# Patient Record
Sex: Female | Born: 2000 | Race: Black or African American | Hispanic: No | Marital: Single | Smoking: Never smoker
Health system: Southern US, Community
[De-identification: ages and names within clinical notes are randomized; demographics above are authoritative.]

## PROBLEM LIST (undated history)

## (undated) DIAGNOSIS — L309 Dermatitis, unspecified: Secondary | ICD-10-CM

## (undated) DIAGNOSIS — F419 Anxiety disorder, unspecified: Secondary | ICD-10-CM

## (undated) DIAGNOSIS — F32A Depression, unspecified: Secondary | ICD-10-CM

## (undated) DIAGNOSIS — D649 Anemia, unspecified: Secondary | ICD-10-CM

## (undated) HISTORY — DX: Depression, unspecified: F32.A

## (undated) HISTORY — PX: NO PAST SURGERIES: SHX2092

## (undated) HISTORY — DX: Anemia, unspecified: D64.9

## (undated) HISTORY — DX: Anxiety disorder, unspecified: F41.9

---
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
## 2021-01-06 ENCOUNTER — Encounter (HOSPITAL_COMMUNITY): Payer: Self-pay

## 2021-01-06 ENCOUNTER — Ambulatory Visit (HOSPITAL_COMMUNITY)
Admission: EM | Admit: 2021-01-06 | Discharge: 2021-01-06 | Disposition: A | Discharge status: Home / Self Care | Payer: Medicaid Other | Attending: Emergency Medicine | Admitting: Emergency Medicine

## 2021-01-06 DIAGNOSIS — B9689 Other specified bacterial agents as the cause of diseases classified elsewhere: Secondary | ICD-10-CM | POA: Diagnosis not present

## 2021-01-06 DIAGNOSIS — N760 Acute vaginitis: Secondary | ICD-10-CM | POA: Diagnosis not present

## 2021-01-06 DIAGNOSIS — Z113 Encounter for screening for infections with a predominantly sexual mode of transmission: Secondary | ICD-10-CM | POA: Insufficient documentation

## 2021-01-06 DIAGNOSIS — N898 Other specified noninflammatory disorders of vagina: Secondary | ICD-10-CM | POA: Diagnosis not present

## 2021-01-06 LAB — HIV ANTIBODY (ROUTINE TESTING W REFLEX): HIV Screen 4th Generation wRfx: NONREACTIVE

## 2021-01-06 NOTE — ED Provider Notes (Signed)
[facility]    CSN: [id number] Arrival date & time: 01/06/21  0865      History   Chief Complaint Chief Complaint  Patient presents with  . Vaginal Discharge  . Vaginal Itching  . STD testing    HPI Beth Curtis is a 20 y.o. female.   Evaluation of vaginal itching and discharge that started after stopping birth control.  Also requesting STD screening.  Reports discharge is thick and white.  Has not taken any OTC medications or treatments. Denies any trauma, injury, or other precipitating event.  Denies any specific alleviating or aggravating factors.  Denies any fevers, chest pain, shortness of breath, N/V/D, numbness, tingling, weakness, abdominal pain, or headaches.   ROS: As per HPI, all other pertinent ROS negative   The history is provided by the patient.  Vaginal Discharge Associated symptoms: vaginal itching   Vaginal Itching    Past Medical History:  Diagnosis Date  . Asthma   . Eczema   . 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 Tachycardia 07/22/2020  . Breakthrough bleeding on Nexplanon 07/08/2020  . H/O chlamydia infection 06/11/2020  . Folliculitis 06/11/2020  . Iron deficiency anemia 09/28/2019  . Prediabetes 09/28/2019  . 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 Allergic rhinitis 10/24/2006    History reviewed. No pertinent surgical history.  OB History   No obstetric history on file.      Home Medications    Prior to Admission medications   Medication Sig Start Date End Date Taking? Authorizing Provider  clobetasol ointment (TEMOVATE) 0.05 % APPLY TOPICALLY TO THE AFFECTED AREA TWICE DAILY 12/31/20  Yes Derrel Nip, MD  desogestrel-ethinyl estradiol (APRI) 0.15-30 MG-MCG tablet Take 1 tablet by mouth daily. 08/11/20  Yes Nestor Ramp, MD  hydrOXYzine (ATARAX/VISTARIL) 25 MG tablet TAKE 1 TABLET BY MOUTH AS NEEDED FOR ITCHING 11/30/20  Yes Maness, Loistine Chance, MD   cetirizine (ZYRTEC) 10 MG tablet Take 1 tablet (10 mg total) by mouth daily. 05/06/20   Dana Allan, MD  ferrous sulfate 324 (65 Fe) MG TBEC Take 1 tablet (325 mg total) by mouth daily. Patient not taking: Reported on 07/08/2020 01/13/20   Myrene Buddy, MD    Family History Family History  Problem Relation Age of Onset  . Cancer Father     Social History Social History   Tobacco Use  . Smoking status: Passive Smoke Exposure - Never Smoker  . Smokeless tobacco: Never Used  Substance Use Topics  . Alcohol use: Not Currently  . Drug use: Not Currently     Allergies   Patient has no known allergies.   Review of Systems Review of Systems  Genitourinary: Positive for vaginal discharge.  All other systems reviewed and are negative.    Physical Exam Triage Vital Signs ED Triage Vitals  Enc Vitals Group     BP 01/06/21 1023 128/76     Pulse Rate 01/06/21 1023 85     Resp 01/06/21 1023 18     Temp 01/06/21 1023 98.1 F (36.7 C)     Temp src --      SpO2 01/06/21 1023 98 %     Weight --      Height --      Head Circumference --      Peak Flow --      Pain Score 01/06/21 1020 0     Pain Loc --  Pain Edu? --      Excl. in [place]? --    No data found.  Updated Vital Signs BP 128/76   Pulse 85   Temp 98.1 F (36.7 C)   Resp 18   LMP 12/13/2020   SpO2 98%   Visual Acuity Right Eye Distance:   Left Eye Distance:   Bilateral Distance:    Right Eye Near:   Left Eye Near:    Bilateral Near:     Physical Exam Vitals and nursing note reviewed.  Constitutional:      General: She is not in acute distress.    Appearance: Normal appearance. She is not ill-appearing, toxic-appearing or diaphoretic.  HENT:     Head: Normocephalic and atraumatic.  Eyes:     Conjunctiva/sclera: Conjunctivae normal.  Cardiovascular:     Rate and Rhythm: Normal rate.     Pulses: Normal pulses.  Pulmonary:     Effort: Pulmonary effort is normal.  Abdominal:     General:  Abdomen is flat.  Genitourinary:    Comments: decline Musculoskeletal:        General: Normal range of motion.     Cervical back: Normal range of motion.  Skin:    General: Skin is warm and dry.  Neurological:     General: No focal deficit present.     Mental Status: She is alert and oriented to person, place, and time.  Psychiatric:        Mood and Affect: Mood normal.      UC Treatments / Results  Labs (all labs ordered are listed, but only abnormal results are displayed) Labs Reviewed  RPR  HIV ANTIBODY (ROUTINE TESTING W REFLEX)  CERVICOVAGINAL ANCILLARY ONLY    EKG   Radiology No results found.  Procedures Procedures (including critical care time)  Medications Ordered in UC Medications - No data to display  Initial Impression / Assessment and Plan / UC Course  I have reviewed the triage vital signs and the nursing notes.  Pertinent labs & imaging results that were available during my care of the patient were reviewed by me and considered in my medical decision making (see chart for details).    Assessment negative for red flags or concerns.  Recommend Monistat or other OTC vaginal itch cream until self swab results.  Self swab obtained and will treat based on results.  Also obtain RPR and HIV lab work.  Discussed safe sex practices including condom or other barrier method use.  Follow-up as needed. Final Clinical Impressions(s) / UC Diagnoses   Final diagnoses:  Acute vaginitis  Vaginal discharge  Screen for STD (sexually transmitted disease)     Discharge Instructions     Use monistat cream for the itching and irritation.    We will contact you with the results from your lab work and any additional treatment.    Do not have sex while taking undergoing treatment for STI.  Make sure that all of your partners get tested and treated.   Use a condom or other barrier method for all sexual encounters.    Return or go to the Emergency Department if  symptoms worsen or do not improve in the next few days.      ED Prescriptions    None     PDMP not reviewed this encounter.   Ivette Loyal, NP 01/06/21 1055

## 2021-01-06 NOTE — ED Triage Notes (Signed)
Pt states would like routine STD testing. She adds that she thinks she has a yeast infection because she has had vaginal itching and white discharge.

## 2021-01-06 NOTE — Discharge Instructions (Signed)
Use monistat cream for the itching and irritation.    We will contact you with the results from your lab work and any additional treatment.    Do not have sex while taking undergoing treatment for STI.  Make sure that all of your partners get tested and treated.   Use a condom or other barrier method for all sexual encounters.    Return or go to the Emergency Department if symptoms worsen or do not improve in the next few days.

## 2021-01-07 LAB — RPR: RPR Ser Ql: NONREACTIVE

## 2021-01-09 ENCOUNTER — Telehealth (HOSPITAL_COMMUNITY): Payer: Self-pay | Admitting: Emergency Medicine

## 2021-01-09 LAB — CERVICOVAGINAL ANCILLARY ONLY
Bacterial Vaginitis (gardnerella): NEGATIVE
Candida Glabrata: NEGATIVE
Candida Vaginitis: POSITIVE — AB
Chlamydia: NEGATIVE
Comment: NEGATIVE
Comment: NEGATIVE
Comment: NEGATIVE
Comment: NEGATIVE
Comment: NEGATIVE
Comment: NORMAL
Neisseria Gonorrhea: NEGATIVE
Trichomonas: NEGATIVE

## 2021-01-09 MED ORDER — FLUCONAZOLE 150 MG PO TABS: 150.0000 mg | ORAL_TABLET | Freq: Once | ORAL | 0 refills | Status: AC

## 2021-02-08 ENCOUNTER — Ambulatory Visit (INDEPENDENT_AMBULATORY_CARE_PROVIDER_SITE_OTHER): Payer: Medicaid Other | Admitting: Family Medicine

## 2021-02-08 ENCOUNTER — Other Ambulatory Visit: Payer: Self-pay

## 2021-02-08 VITALS — BP 135/84 | HR 91 | Wt 233.4 lb

## 2021-02-08 DIAGNOSIS — N898 Other specified noninflammatory disorders of vagina: Secondary | ICD-10-CM

## 2021-02-08 DIAGNOSIS — Z131 Encounter for screening for diabetes mellitus: Secondary | ICD-10-CM

## 2021-02-08 LAB — POCT WET PREP (WET MOUNT)
Clue Cells Wet Prep Whiff POC: NEGATIVE
Trichomonas Wet Prep HPF POC: ABSENT

## 2021-02-08 LAB — POCT GLYCOSYLATED HEMOGLOBIN (HGB A1C): Hemoglobin A1C: 5.5 % (ref 4.0–5.6)

## 2021-02-08 MED ORDER — FLUCONAZOLE 150 MG PO TABS: ORAL_TABLET | ORAL | 0 refills | Status: DC

## 2021-02-08 NOTE — Assessment & Plan Note (Addendum)
Wet prep notable for yeast.  Declined STD testing - Will treat with Diflucan x 2.  - F/U if symptoms not improving or getting worse.  - F/U with PCP as needed.  - second yeast infection in 2 months. Discussed Self care instructions. Diabetes screen negative.

## 2021-02-08 NOTE — Progress Notes (Signed)
  Subjective    Beth Curtis is a 20 y.o. female who complains of white and thick vaginal discharge for 2-3 days. Itching and irritated. Notes having a yeast infection about 1 month ago.  Denies abnormal vaginal bleeding, significant pelvic pain or fever.. Denies dysuria but does endorse frequency. Sexually active with 1 female partner. Denies history of known exposure to STD. Not Interested in STD testing.  LMP: 01/13/21. No history of STD's.  No abdominal pain, nausea, or vomiting.  No fevers or chills.     Review of Systems  See HPI above for review of systems.    Objective:    BP 135/84   Pulse 91   Wt 233 lb 6.4 oz (105.9 kg)   SpO2 100%   BMI 44.10 kg/m   Gen:  She appears well, afebrile.  Resp: breathing comfortably on room air, speaking in full sentences GYN:  External genitalia within normal limits.  Vaginal mucosa pink, moist, normal rugae. Thick white cottage cheese discharge throughout.  Nonfriable cervix but very erythematous, copious amount of thick white cottage cheese discharge throughout, no bleeding noted on speculum exam.  Bimanual exam revealed normal, nongravid uterus.  Mild tenderness to cervical palpation but no significant pain. No adnexal masses bilaterally.   Exam Chaperoned by: Deserree B.   Assessment & Plan:   Vaginal discharge Wet prep notable for yeast.  Declined STD testing - Will treat with Diflucan x 2.  - F/U if symptoms not improving or getting worse.  - F/U with PCP as needed.  - second yeast infection in 2 months. Discussed Self care instructions. Diabetes screen negative.    Orders Placed This Encounter  Procedures   POCT glycosylated hemoglobin (Hb A1C)   POCT Wet Prep [facility])    Orpah Cobb, DO [facility], PGY3 02/08/2021 11:48 AM

## 2021-02-28 DIAGNOSIS — J302 Other seasonal allergic rhinitis: Secondary | ICD-10-CM | POA: Diagnosis not present

## 2021-02-28 DIAGNOSIS — R5383 Other fatigue: Secondary | ICD-10-CM | POA: Diagnosis not present

## 2021-02-28 DIAGNOSIS — R7303 Prediabetes: Secondary | ICD-10-CM | POA: Diagnosis not present

## 2021-02-28 DIAGNOSIS — M940 Chondrocostal junction syndrome [Tietze]: Secondary | ICD-10-CM | POA: Diagnosis not present

## 2021-02-28 DIAGNOSIS — D508 Other iron deficiency anemias: Secondary | ICD-10-CM | POA: Diagnosis not present

## 2021-03-04 DIAGNOSIS — B373 Candidiasis of vulva and vagina: Secondary | ICD-10-CM | POA: Diagnosis not present

## 2021-03-29 DIAGNOSIS — J302 Other seasonal allergic rhinitis: Secondary | ICD-10-CM | POA: Diagnosis not present

## 2021-03-29 DIAGNOSIS — R5383 Other fatigue: Secondary | ICD-10-CM | POA: Diagnosis not present

## 2021-03-29 DIAGNOSIS — D508 Other iron deficiency anemias: Secondary | ICD-10-CM | POA: Diagnosis not present

## 2021-03-29 DIAGNOSIS — R7303 Prediabetes: Secondary | ICD-10-CM | POA: Diagnosis not present

## 2021-04-03 ENCOUNTER — Other Ambulatory Visit: Payer: Self-pay | Admitting: Family Medicine

## 2021-04-03 DIAGNOSIS — L2082 Flexural eczema: Secondary | ICD-10-CM

## 2021-04-28 DIAGNOSIS — R7303 Prediabetes: Secondary | ICD-10-CM | POA: Diagnosis not present

## 2021-04-28 DIAGNOSIS — R000 Tachycardia, unspecified: Secondary | ICD-10-CM | POA: Diagnosis not present

## 2021-04-28 DIAGNOSIS — J302 Other seasonal allergic rhinitis: Secondary | ICD-10-CM | POA: Diagnosis not present

## 2021-04-28 DIAGNOSIS — R002 Palpitations: Secondary | ICD-10-CM | POA: Diagnosis not present

## 2021-04-28 DIAGNOSIS — D508 Other iron deficiency anemias: Secondary | ICD-10-CM | POA: Diagnosis not present

## 2021-04-28 DIAGNOSIS — R5383 Other fatigue: Secondary | ICD-10-CM | POA: Diagnosis not present

## 2021-05-19 ENCOUNTER — Ambulatory Visit: Payer: Medicaid Other | Admitting: Cardiology

## 2021-05-25 ENCOUNTER — Encounter: Payer: Self-pay | Admitting: Cardiology

## 2021-05-25 ENCOUNTER — Ambulatory Visit: Payer: Medicaid Other | Admitting: Cardiology

## 2021-05-25 ENCOUNTER — Other Ambulatory Visit: Payer: Self-pay

## 2021-05-25 VITALS — BP 134/82 | HR 108 | Temp 98.2°F | Ht 61.0 in | Wt 237.0 lb

## 2021-05-25 DIAGNOSIS — R002 Palpitations: Secondary | ICD-10-CM | POA: Diagnosis not present

## 2021-05-25 DIAGNOSIS — R0789 Other chest pain: Secondary | ICD-10-CM

## 2021-05-25 DIAGNOSIS — D500 Iron deficiency anemia secondary to blood loss (chronic): Secondary | ICD-10-CM | POA: Diagnosis not present

## 2021-05-25 DIAGNOSIS — R9431 Abnormal electrocardiogram [ECG] [EKG]: Secondary | ICD-10-CM

## 2021-05-25 NOTE — Progress Notes (Signed)
Primary Physician/Referring:  Norm Salt, PA  Patient ID: Beth Curtis, female    DOB: 10-17-2000, 20 y.o.   MRN: [id number]  Chief Complaint  Patient presents with   Tachycardia   New Patient (Initial Visit)   Chest Pain   HPI:    Beth Curtis  is a 20 y.o. African-American female patient referred to me for evaluation of palpitations and chest pain.  Palpitations described as increased heart rate with doing minimal activity and sometimes occurring at rest, notices her heart rate goes all the way up to 120 230 bpm.  No other associated symptoms of dizziness or syncope or chest pain.  She also complains of left-sided chest pain and localizes with her finger at the left parasternal region.  States that it comes on and off, last a few seconds to few minutes, sometimes loose behind tightness like sensation in the chest.  Does not seem to be related to exertional activity.  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 No pertinent surgical history. Family History  Problem Relation Age of Onset   Cancer Father    Cancer Maternal Grandfather    Cancer Paternal Grandmother     Social History   Tobacco Use   Smoking status: Never    Passive exposure: Yes   Smokeless tobacco: Never  Substance Use Topics   Alcohol use: Not Currently   Marital Status: Single  ROS  Review of Systems  Cardiovascular:  Positive for chest pain and palpitations. Negative for dyspnea on exertion and leg swelling.  Gastrointestinal:  Negative for melena.  Objective  Blood pressure 134/82, pulse (!) 108, temperature 98.2 F (36.8 C), temperature source Temporal, height 5\' 1"  (1.549 m), weight 237 lb (107.5 kg), SpO2 100 %. Body mass index is 44.78 kg/m.  Vitals with BMI 05/25/2021 02/08/2021 01/06/2021  Height 5\' 1"  - -  Weight 237 lbs 233 lbs 6 oz -  BMI 44.8 - -  Systolic 134 135 01/08/2021  Diastolic 82 84 76  Pulse 108 91 85      Physical Exam Constitutional:      Comments: Morbidly obese  Neck:     Vascular: No carotid bruit or JVD.  Cardiovascular:     Rate and Rhythm: Normal rate and regular rhythm.     Pulses: Intact distal pulses.     Heart sounds: Normal heart sounds. No murmur heard.   No gallop.  Pulmonary:     Effort: Pulmonary effort is normal.     Breath sounds: Normal breath sounds.  Abdominal:     General: Bowel sounds are normal.     Palpations: Abdomen is soft.  Musculoskeletal:        General: No swelling.     Laboratory examination:   No results for input(s): NA, K, CL, CO2, GLUCOSE, BUN, CREATININE, CALCIUM, GFRNONAA, GFRAA in the last 8760 hours. CrCl cannot be calculated (No successful lab value found.).  No flowsheet data found. CBC Latest Ref Rng & Units 07/20/2020 07/08/2020 09/25/2019  WBC 3.4 - 10.8 x10E3/uL 10.7 10.8 8.9  Hemoglobin 11.1 - 15.9 g/dL 10.0(L) 9.7(L) 10.8(L)  Hematocrit 34.0 - 46.6 % 33.3(L) 32.9(L) 34.9  Platelets 150 - 450 x10E3/uL 574(H) 590(H) 347    Lipid Panel No results for input(s): CHOL, TRIG, LDLCALC, VLDL, HDL, CHOLHDL, LDLDIRECT in the last 8760 hours. Lipid Panel     Component Value Date/Time   CHOL  150 09/25/2019 1413   TRIG 66 09/25/2019 1413   HDL 44 09/25/2019 1413   CHOLHDL 3.4 09/25/2019 1413   LDLCALC 93 09/25/2019 1413   LABVLDL 13 09/25/2019 1413     HEMOGLOBIN A1C Lab Results  Component Value Date   HGBA1C 5.5 02/08/2021   TSH No results for input(s): TSH in the last 8760 hours.  External labs:   Labs 03/29/2021: Hb 8.9/HCT 32.2, platelets 564.  Microcytic indicis.  Medications and allergies  No Known Allergies   Medication prior to this encounter:   Outpatient Medications Prior to Visit  Medication Sig Dispense Refill   cetirizine (ZYRTEC) 10 MG tablet Take 1 tablet (10 mg total) by mouth daily. 90 tablet 3   clobetasol ointment (TEMOVATE) 0.05 % APPLY TOPICALLY TO THE AFFECTED AREA TWICE DAILY 90 g 1   FEROSUL  325 (65 Fe) MG tablet Take 325 mg by mouth 3 (three) times daily.     hydrOXYzine (ATARAX/VISTARIL) 25 MG tablet TAKE 1 TABLET BY MOUTH AS NEEDED FOR ITCHING 90 tablet 0   propranolol (INDERAL) 20 MG tablet Take 20 mg by mouth 2 (two) times daily.     desogestrel-ethinyl estradiol (APRI) 0.15-30 MG-MCG tablet Take 1 tablet by mouth daily. (Patient not taking: Reported on 05/25/2021) 90 tablet 3   fluconazole (DIFLUCAN) 150 MG tablet Take 1 tablet now, then 1 tablet in 72 hours 2 tablet 0   No facility-administered medications prior to visit.     Medication list after today's encounter   Current Outpatient Medications  Medication Instructions   cetirizine (ZYRTEC) 10 mg, Oral, Daily   clobetasol ointment (TEMOVATE) 0.05 % APPLY TOPICALLY TO THE AFFECTED AREA TWICE DAILY   desogestrel-ethinyl estradiol (APRI) 0.15-30 MG-MCG tablet 1 tablet, Oral, Daily   FeroSul 325 mg, Oral, 3 times daily   hydrOXYzine (ATARAX/VISTARIL) 25 MG tablet TAKE 1 TABLET BY MOUTH AS NEEDED FOR ITCHING   propranolol (INDERAL) 20 mg, Oral, 2 times daily    Radiology:   No results found.  Cardiac Studies:   NA  EKG:   EKG 05/25/2021: Sinus rhythm with short PR interval, PR interval 110 ms, with sinus arrhythmia at rate of 93 bpm, voltage criteria for LVH but may be a normal variant for age.   Ventricular rate 93 bpm.    Assessment     ICD-10-CM   1. Palpitations  R00.2 EKG 12-Lead    LONG TERM MONITOR (3-14 DAYS)    2. Chest pain, musculoskeletal  R07.89 [facility] ECHOCARDIOGRAM COMPLETE    3. Shortened PR interval  R94.31 LONG TERM MONITOR (3-14 DAYS)    [facility] ECHOCARDIOGRAM COMPLETE    4. Iron deficiency anemia due to chronic blood loss  D50.0        Medications Discontinued During This Encounter  Medication Reason   fluconazole (DIFLUCAN) 150 MG tablet Error    No orders of the defined types were placed in this encounter.  Orders Placed This Encounter  Procedures   LONG TERM MONITOR (3-14 DAYS)     Standing Status:   Future    Number of Occurrences:   1    Standing Expiration Date:   07/25/2021    Order Specific Question:   Where should this test be performed?    Answer:   [facility]    Order Specific Question:   Does the patient have an implanted cardiac device?    Answer:   No    Order Specific Question:   Prescribed days of wear  Answer:   7   EKG 12-Lead   [facility] ECHOCARDIOGRAM COMPLETE    Standing Status:   Future    Standing Expiration Date:   05/25/2022   Recommendations:   Beth Curtis is a 20 y.o. African-American female patient referred to me for evaluation of palpitations and chest pain.  Except for morbid obesity no other significant prior cardiovascular history.  Her symptoms of palpitations are related to sinus tachycardia from obesity hypoventilation and deconditioning.  Do not suspect any other significant abnormalities.  She does have mildly abnormal EKG with short PR interval.  Hence to exclude SVT, will obtain a 7-day extended EKG monitoring and also perform an echocardiogram.  With regard to her chest pain symptoms, clearly appears musculoskeletal. Reasons for reproducible pain explained to the patient. Heat to tender area. Cold ice compressions to reduce inflammation. Recommended Aleve OTC 1-2 tablets BID for 3-4 days and PRN. Reassured the patient.    I have extensively discussed with the patient regarding weight loss and calorie reduction.  She is severely anemic, do not know the etiology, needs follow-up with her PCP regarding possible iron deficiency anemia with severely microcytic indicis.  She also needs a TSH as I do not see the report of TSH.  I will personally perform the test and if I find abnormalities,  will perform further evaluation. Otherwise unless new on ongoing symptoms(patient advised to contact us), preventive  therapy is recommended. I will then see the patient on a PRN basis.     Yates Decamp, MD, [facility] 05/27/2021, 2:01 PM Office:  [phone]

## 2021-06-01 ENCOUNTER — Inpatient Hospital Stay: Payer: Medicaid Other

## 2021-06-01 ENCOUNTER — Ambulatory Visit: Payer: Medicaid Other

## 2021-06-01 ENCOUNTER — Other Ambulatory Visit: Payer: Self-pay

## 2021-06-01 DIAGNOSIS — R9431 Abnormal electrocardiogram [ECG] [EKG]: Secondary | ICD-10-CM

## 2021-06-01 DIAGNOSIS — R0789 Other chest pain: Secondary | ICD-10-CM

## 2021-06-01 DIAGNOSIS — R002 Palpitations: Secondary | ICD-10-CM | POA: Diagnosis not present

## 2021-06-15 DIAGNOSIS — R9431 Abnormal electrocardiogram [ECG] [EKG]: Secondary | ICD-10-CM | POA: Diagnosis not present

## 2021-06-15 DIAGNOSIS — R002 Palpitations: Secondary | ICD-10-CM | POA: Diagnosis not present

## 2021-06-15 DIAGNOSIS — N760 Acute vaginitis: Secondary | ICD-10-CM | POA: Diagnosis not present

## 2021-06-15 DIAGNOSIS — B379 Candidiasis, unspecified: Secondary | ICD-10-CM | POA: Diagnosis not present

## 2021-07-14 DIAGNOSIS — R002 Palpitations: Secondary | ICD-10-CM | POA: Diagnosis not present

## 2021-07-14 DIAGNOSIS — R5383 Other fatigue: Secondary | ICD-10-CM | POA: Diagnosis not present

## 2021-07-14 DIAGNOSIS — R000 Tachycardia, unspecified: Secondary | ICD-10-CM | POA: Diagnosis not present

## 2021-07-14 DIAGNOSIS — D508 Other iron deficiency anemias: Secondary | ICD-10-CM | POA: Diagnosis not present

## 2021-07-14 DIAGNOSIS — J302 Other seasonal allergic rhinitis: Secondary | ICD-10-CM | POA: Diagnosis not present

## 2021-07-14 DIAGNOSIS — F419 Anxiety disorder, unspecified: Secondary | ICD-10-CM | POA: Diagnosis not present

## 2021-07-14 DIAGNOSIS — R7303 Prediabetes: Secondary | ICD-10-CM | POA: Diagnosis not present

## 2021-07-31 DIAGNOSIS — J302 Other seasonal allergic rhinitis: Secondary | ICD-10-CM | POA: Diagnosis not present

## 2021-07-31 DIAGNOSIS — R5383 Other fatigue: Secondary | ICD-10-CM | POA: Diagnosis not present

## 2021-07-31 DIAGNOSIS — R002 Palpitations: Secondary | ICD-10-CM | POA: Diagnosis not present

## 2021-07-31 DIAGNOSIS — R7303 Prediabetes: Secondary | ICD-10-CM | POA: Diagnosis not present

## 2021-07-31 DIAGNOSIS — D508 Other iron deficiency anemias: Secondary | ICD-10-CM | POA: Diagnosis not present

## 2021-07-31 DIAGNOSIS — F419 Anxiety disorder, unspecified: Secondary | ICD-10-CM | POA: Diagnosis not present

## 2021-07-31 DIAGNOSIS — R000 Tachycardia, unspecified: Secondary | ICD-10-CM | POA: Diagnosis not present

## 2021-08-24 ENCOUNTER — Other Ambulatory Visit: Payer: Self-pay | Admitting: Family Medicine

## 2021-08-24 DIAGNOSIS — L2082 Flexural eczema: Secondary | ICD-10-CM

## 2021-09-04 ENCOUNTER — Ambulatory Visit (HOSPITAL_COMMUNITY)
Admission: EM | Admit: 2021-09-04 | Discharge: 2021-09-04 | Disposition: A | Discharge status: Home / Self Care | Payer: Medicaid Other | Attending: Physician Assistant | Admitting: Physician Assistant

## 2021-09-04 ENCOUNTER — Encounter (HOSPITAL_COMMUNITY): Payer: Self-pay

## 2021-09-04 ENCOUNTER — Other Ambulatory Visit: Payer: Self-pay

## 2021-09-04 DIAGNOSIS — R002 Palpitations: Secondary | ICD-10-CM | POA: Diagnosis not present

## 2021-09-04 DIAGNOSIS — B3731 Acute candidiasis of vulva and vagina: Secondary | ICD-10-CM | POA: Diagnosis not present

## 2021-09-04 DIAGNOSIS — R7303 Prediabetes: Secondary | ICD-10-CM | POA: Diagnosis not present

## 2021-09-04 DIAGNOSIS — N898 Other specified noninflammatory disorders of vagina: Secondary | ICD-10-CM | POA: Insufficient documentation

## 2021-09-04 DIAGNOSIS — D508 Other iron deficiency anemias: Secondary | ICD-10-CM | POA: Diagnosis not present

## 2021-09-04 DIAGNOSIS — J302 Other seasonal allergic rhinitis: Secondary | ICD-10-CM | POA: Diagnosis not present

## 2021-09-04 DIAGNOSIS — L2082 Flexural eczema: Secondary | ICD-10-CM | POA: Diagnosis not present

## 2021-09-04 DIAGNOSIS — Z113 Encounter for screening for infections with a predominantly sexual mode of transmission: Secondary | ICD-10-CM | POA: Diagnosis not present

## 2021-09-04 DIAGNOSIS — R5383 Other fatigue: Secondary | ICD-10-CM | POA: Diagnosis not present

## 2021-09-04 DIAGNOSIS — F419 Anxiety disorder, unspecified: Secondary | ICD-10-CM | POA: Diagnosis not present

## 2021-09-04 MED ORDER — FLUCONAZOLE 150 MG PO TABS: 150.0000 mg | ORAL_TABLET | ORAL | 0 refills | Status: AC

## 2021-09-04 NOTE — ED Triage Notes (Signed)
Pt presents with c/o vaginal irritation x 2 days.   Denies vaginal discharge.

## 2021-09-04 NOTE — Discharge Instructions (Signed)
We are treating you for a yeast infection.  Take Diflucan every 3 days for up to 3 doses.  Wear loosefitting cotton underwear and use of electronic soaps or detergents.  We will contact you if we need to arrange any additional treatment.  If you have any worsening symptoms please return for reevaluation.

## 2021-09-04 NOTE — ED Provider Notes (Signed)
[place]    CSN: [id number] Arrival date & time: 09/04/21  0810      History   Chief Complaint Chief Complaint  Patient presents with   vaginal irritation    HPI Beth Curtis is a 21 y.o. female.   Patient presents today with a 2-day history of vaginal irritation.  She describes this as pruritus and irritation.  Denies any discharge but does have a history of vaginal yeast infections that began with similar symptoms.  She has tried over-the-counter miconazole without improvement of symptoms; reports this actually caused increased irritation.  She denies any changes to personal hygiene products including soaps or detergents.  Denies any recent antibiotic use.  She does have a history of prediabetes but denies history of diabetes does not take any diabetic medication including SGLT 2 inhibitor.  She is confident that she is not pregnant.   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 No pertinent surgical history.  OB History   No obstetric history on file.      Home Medications    Prior to Admission medications   Medication Sig Start Date End Date Taking? Authorizing Provider  fluconazole (DIFLUCAN) 150 MG tablet Take 1 tablet (150 mg total) by mouth every 3 (three) days for 3 doses. 09/04/21 09/11/21 Yes Cataleyah Colborn, Derry Skill, PA-C  cetirizine (ZYRTEC) 10 MG tablet Take 1 tablet (10 mg total) by mouth daily. 05/06/20   Carollee Leitz, MD  clobetasol ointment (TEMOVATE) 0.05 % APPLY TOPICALLY TO THE AFFECTED AREA TWICE DAILY 04/04/21    Maness, Arnette Norris, MD  desogestrel-ethinyl estradiol (APRI) 0.15-30 MG-MCG tablet Take 1 tablet by mouth daily. Patient not taking: Reported on 05/25/2021 08/11/20   Dickie La, MD  FEROSUL 325 (65 Fe) MG tablet Take 325 mg by mouth 3 (three) times daily. 03/29/21   [provider]  hydrOXYzine (ATARAX/VISTARIL) 25 MG tablet TAKE 1 TABLET BY MOUTH AS NEEDED FOR ITCHING 11/30/20   Maness, Arnette Norris, MD  propranolol (INDERAL) 20 MG tablet Take 20 mg by mouth 2 (two) times daily. 04/28/21   [provider]    Family History Family History  Problem Relation Age of Onset   Cancer Father    Cancer Maternal Grandfather    Cancer Paternal Grandmother     Social History Social History   Tobacco Use   Smoking status: Never    Passive exposure: Yes   Smokeless tobacco: Never  Substance Use Topics   Alcohol use: Not Currently   Drug use: Not Currently     Allergies   Patient has no known allergies.   Review of Systems Review of Systems  Constitutional:  Negative for activity change, appetite change, fatigue and fever.  Respiratory:  Negative for cough and shortness of breath.   Cardiovascular:  Negative for chest pain.  Gastrointestinal:  Negative for abdominal pain, diarrhea, nausea and vomiting.  Genitourinary:  Positive for vaginal pain (irritation/ itching). Negative for dysuria, frequency, pelvic pain, urgency, vaginal bleeding and vaginal discharge.    Physical Exam Triage Vital Signs  ED Triage Vitals [09/04/21 0830]  Enc Vitals Group     BP 116/66     Pulse Rate 97     Resp 19     Temp 98.6 F (37 C)     Temp Source Oral     SpO2 98 %     Weight      Height      Head Circumference      Peak Flow      Pain Score      Pain Loc      Pain Edu?      Excl. in [place]?    No data found.  Updated Vital Signs BP 116/66 (BP Location: Left Arm)    Pulse 97    Temp 98.6 F (37 C) (Oral)    Resp 19    LMP 08/26/2021 (Exact Date)    SpO2 98%   Visual Acuity Right  Eye Distance:   Left Eye Distance:   Bilateral Distance:    Right Eye Near:   Left Eye Near:    Bilateral Near:     Physical Exam Vitals reviewed.  Constitutional:      General: She is awake. She is not in acute distress.    Appearance: Normal appearance. She is well-developed. She is not ill-appearing.     Comments: Very pleasant female appears stated age in no acute distress sitting comfortably in exam room  HENT:     Head: Normocephalic and atraumatic.  Cardiovascular:     Rate and Rhythm: Normal rate and regular rhythm.     Heart sounds: Normal heart sounds, S1 normal and S2 normal. No murmur heard. Pulmonary:     Effort: Pulmonary effort is normal.     Breath sounds: Normal breath sounds. No wheezing, rhonchi or rales.     Comments: Very pleasant female appears stated age in no acute distress sitting comfortably in exam room Abdominal:     General: Bowel sounds are normal.     Palpations: Abdomen is soft.     Tenderness: There is no abdominal tenderness. There is no right CVA tenderness, left CVA tenderness, guarding or rebound.     Comments: Benign abdominal exam  Genitourinary:    Comments: Exam deferred Psychiatric:        Behavior: Behavior is cooperative.     UC Treatments / Results  Labs (all labs ordered are listed, but only abnormal results are displayed) Labs Reviewed  CERVICOVAGINAL ANCILLARY ONLY    EKG   Radiology No results found.  Procedures Procedures (including critical care time)  Medications Ordered in UC Medications - No data to display  Initial Impression / Assessment and Plan / UC Course  I have reviewed the triage vital signs and the nursing notes.  Pertinent labs & imaging results that were available during my care of the patient were reviewed by me and considered in my medical decision making (see chart for details).     Patient apparently treated for yeast infection given clinical presentation with Diflucan every 72 hours for  up to 3 doses.  Recommended she use loosefitting cotton underwear and hypoallergenic soaps and detergents for symptom relief.  STI swab was collected and we will contact patient if additional treatment is required.  Discussed that if symptoms persist she may benefit from seeing OB/GYN.  Recommended follow-up with either PCP or OB/GYN next week if symptoms or not improving.  Discussed alarm symptoms that warrant emergent evaluation including abdominal pain, pelvic pain, increased vaginal discharge,  nausea, vomiting.  Strict return precautions given to which she expressed understanding.  Final Clinical Impressions(s) / UC Diagnoses   Final diagnoses:  Vaginal irritation  Vaginal yeast infection  Routine screening for STI (sexually transmitted infection)     Discharge Instructions      We are treating you for a yeast infection.  Take Diflucan every 3 days for up to 3 doses.  Wear loosefitting cotton underwear and use of electronic soaps or detergents.  We will contact you if we need to arrange any additional treatment.  If you have any worsening symptoms please return for reevaluation.     ED Prescriptions     Medication Sig Dispense Auth. Provider   fluconazole (DIFLUCAN) 150 MG tablet Take 1 tablet (150 mg total) by mouth every 3 (three) days for 3 doses. 3 tablet Traniyah Hallett, Derry Skill, PA-C      PDMP not reviewed this encounter.   Terrilee Croak, PA-C 09/04/21 [id number]

## 2021-09-05 LAB — CERVICOVAGINAL ANCILLARY ONLY
Bacterial Vaginitis (gardnerella): NEGATIVE
Candida Glabrata: NEGATIVE
Candida Vaginitis: POSITIVE — AB
Chlamydia: NEGATIVE
Comment: NEGATIVE
Comment: NEGATIVE
Comment: NEGATIVE
Comment: NEGATIVE
Comment: NEGATIVE
Comment: NORMAL
Neisseria Gonorrhea: NEGATIVE
Trichomonas: NEGATIVE

## 2021-09-22 DIAGNOSIS — H5213 Myopia, bilateral: Secondary | ICD-10-CM | POA: Diagnosis not present

## 2021-10-13 DIAGNOSIS — R7303 Prediabetes: Secondary | ICD-10-CM | POA: Diagnosis not present

## 2021-10-13 DIAGNOSIS — R002 Palpitations: Secondary | ICD-10-CM | POA: Diagnosis not present

## 2021-10-13 DIAGNOSIS — L2082 Flexural eczema: Secondary | ICD-10-CM | POA: Diagnosis not present

## 2021-10-13 DIAGNOSIS — D508 Other iron deficiency anemias: Secondary | ICD-10-CM | POA: Diagnosis not present

## 2021-10-13 DIAGNOSIS — J302 Other seasonal allergic rhinitis: Secondary | ICD-10-CM | POA: Diagnosis not present

## 2021-10-13 DIAGNOSIS — R5383 Other fatigue: Secondary | ICD-10-CM | POA: Diagnosis not present

## 2021-10-13 DIAGNOSIS — F419 Anxiety disorder, unspecified: Secondary | ICD-10-CM | POA: Diagnosis not present

## 2021-11-24 DIAGNOSIS — L2082 Flexural eczema: Secondary | ICD-10-CM | POA: Diagnosis not present

## 2021-11-24 DIAGNOSIS — Z113 Encounter for screening for infections with a predominantly sexual mode of transmission: Secondary | ICD-10-CM | POA: Diagnosis not present

## 2021-11-24 DIAGNOSIS — J302 Other seasonal allergic rhinitis: Secondary | ICD-10-CM | POA: Diagnosis not present

## 2021-11-24 DIAGNOSIS — F419 Anxiety disorder, unspecified: Secondary | ICD-10-CM | POA: Diagnosis not present

## 2021-11-24 DIAGNOSIS — R7303 Prediabetes: Secondary | ICD-10-CM | POA: Diagnosis not present

## 2021-11-24 DIAGNOSIS — D508 Other iron deficiency anemias: Secondary | ICD-10-CM | POA: Diagnosis not present

## 2021-12-25 DIAGNOSIS — J029 Acute pharyngitis, unspecified: Secondary | ICD-10-CM | POA: Diagnosis not present

## 2021-12-25 DIAGNOSIS — F419 Anxiety disorder, unspecified: Secondary | ICD-10-CM | POA: Diagnosis not present

## 2021-12-25 DIAGNOSIS — J302 Other seasonal allergic rhinitis: Secondary | ICD-10-CM | POA: Diagnosis not present

## 2021-12-25 DIAGNOSIS — D508 Other iron deficiency anemias: Secondary | ICD-10-CM | POA: Diagnosis not present

## 2021-12-25 DIAGNOSIS — R768 Other specified abnormal immunological findings in serum: Secondary | ICD-10-CM | POA: Diagnosis not present

## 2021-12-25 DIAGNOSIS — L2082 Flexural eczema: Secondary | ICD-10-CM | POA: Diagnosis not present

## 2021-12-25 DIAGNOSIS — R7303 Prediabetes: Secondary | ICD-10-CM | POA: Diagnosis not present

## 2022-01-08 DIAGNOSIS — J302 Other seasonal allergic rhinitis: Secondary | ICD-10-CM | POA: Diagnosis not present

## 2022-01-08 DIAGNOSIS — R7303 Prediabetes: Secondary | ICD-10-CM | POA: Diagnosis not present

## 2022-01-08 DIAGNOSIS — F419 Anxiety disorder, unspecified: Secondary | ICD-10-CM | POA: Diagnosis not present

## 2022-01-08 DIAGNOSIS — L2082 Flexural eczema: Secondary | ICD-10-CM | POA: Diagnosis not present

## 2022-01-08 DIAGNOSIS — R768 Other specified abnormal immunological findings in serum: Secondary | ICD-10-CM | POA: Diagnosis not present

## 2022-01-08 DIAGNOSIS — D508 Other iron deficiency anemias: Secondary | ICD-10-CM | POA: Diagnosis not present

## 2022-04-27 DIAGNOSIS — R768 Other specified abnormal immunological findings in serum: Secondary | ICD-10-CM | POA: Diagnosis not present

## 2022-04-27 DIAGNOSIS — D508 Other iron deficiency anemias: Secondary | ICD-10-CM | POA: Diagnosis not present

## 2022-04-27 DIAGNOSIS — Z113 Encounter for screening for infections with a predominantly sexual mode of transmission: Secondary | ICD-10-CM | POA: Diagnosis not present

## 2022-04-27 DIAGNOSIS — L2082 Flexural eczema: Secondary | ICD-10-CM | POA: Diagnosis not present

## 2022-04-27 DIAGNOSIS — J302 Other seasonal allergic rhinitis: Secondary | ICD-10-CM | POA: Diagnosis not present

## 2022-04-27 DIAGNOSIS — F419 Anxiety disorder, unspecified: Secondary | ICD-10-CM | POA: Diagnosis not present

## 2022-04-27 DIAGNOSIS — R7303 Prediabetes: Secondary | ICD-10-CM | POA: Diagnosis not present

## 2022-05-01 DIAGNOSIS — J029 Acute pharyngitis, unspecified: Secondary | ICD-10-CM | POA: Diagnosis not present

## 2022-05-01 DIAGNOSIS — R070 Pain in throat: Secondary | ICD-10-CM | POA: Diagnosis not present

## 2022-05-19 DIAGNOSIS — L02215 Cutaneous abscess of perineum: Secondary | ICD-10-CM | POA: Diagnosis not present

## 2022-05-19 DIAGNOSIS — L02214 Cutaneous abscess of groin: Secondary | ICD-10-CM | POA: Diagnosis not present

## 2022-05-19 DIAGNOSIS — Z6841 Body Mass Index (BMI) 40.0 and over, adult: Secondary | ICD-10-CM | POA: Diagnosis not present

## 2022-05-19 DIAGNOSIS — E669 Obesity, unspecified: Secondary | ICD-10-CM | POA: Diagnosis not present

## 2022-05-20 DIAGNOSIS — Z48817 Encounter for surgical aftercare following surgery on the skin and subcutaneous tissue: Secondary | ICD-10-CM | POA: Diagnosis not present

## 2022-06-07 DIAGNOSIS — R771 Abnormality of globulin: Secondary | ICD-10-CM | POA: Diagnosis not present

## 2022-06-07 DIAGNOSIS — R768 Other specified abnormal immunological findings in serum: Secondary | ICD-10-CM | POA: Diagnosis not present

## 2022-06-07 DIAGNOSIS — R7303 Prediabetes: Secondary | ICD-10-CM | POA: Diagnosis not present

## 2022-06-07 DIAGNOSIS — E871 Hypo-osmolality and hyponatremia: Secondary | ICD-10-CM | POA: Diagnosis not present

## 2022-06-07 DIAGNOSIS — J302 Other seasonal allergic rhinitis: Secondary | ICD-10-CM | POA: Diagnosis not present

## 2022-06-07 DIAGNOSIS — L2082 Flexural eczema: Secondary | ICD-10-CM | POA: Diagnosis not present

## 2022-06-07 DIAGNOSIS — D508 Other iron deficiency anemias: Secondary | ICD-10-CM | POA: Diagnosis not present

## 2022-06-07 DIAGNOSIS — F419 Anxiety disorder, unspecified: Secondary | ICD-10-CM | POA: Diagnosis not present

## 2022-06-28 DIAGNOSIS — D508 Other iron deficiency anemias: Secondary | ICD-10-CM | POA: Diagnosis not present

## 2022-06-28 DIAGNOSIS — R768 Other specified abnormal immunological findings in serum: Secondary | ICD-10-CM | POA: Diagnosis not present

## 2022-06-28 DIAGNOSIS — E871 Hypo-osmolality and hyponatremia: Secondary | ICD-10-CM | POA: Diagnosis not present

## 2022-06-28 DIAGNOSIS — L2082 Flexural eczema: Secondary | ICD-10-CM | POA: Diagnosis not present

## 2022-06-28 DIAGNOSIS — F419 Anxiety disorder, unspecified: Secondary | ICD-10-CM | POA: Diagnosis not present

## 2022-06-28 DIAGNOSIS — R7303 Prediabetes: Secondary | ICD-10-CM | POA: Diagnosis not present

## 2022-06-28 DIAGNOSIS — J302 Other seasonal allergic rhinitis: Secondary | ICD-10-CM | POA: Diagnosis not present

## 2022-08-16 DIAGNOSIS — L0292 Furuncle, unspecified: Secondary | ICD-10-CM | POA: Diagnosis not present

## 2022-11-21 DIAGNOSIS — B9689 Other specified bacterial agents as the cause of diseases classified elsewhere: Secondary | ICD-10-CM | POA: Diagnosis not present

## 2022-11-21 DIAGNOSIS — S30814A Abrasion of vagina and vulva, initial encounter: Secondary | ICD-10-CM | POA: Diagnosis not present

## 2022-11-21 DIAGNOSIS — N760 Acute vaginitis: Secondary | ICD-10-CM | POA: Diagnosis not present

## 2022-11-21 LAB — OB RESULTS CONSOLE GC/CHLAMYDIA: Chlamydia: NEGATIVE

## 2022-11-23 DIAGNOSIS — D508 Other iron deficiency anemias: Secondary | ICD-10-CM | POA: Diagnosis not present

## 2022-11-23 DIAGNOSIS — R768 Other specified abnormal immunological findings in serum: Secondary | ICD-10-CM | POA: Diagnosis not present

## 2022-11-23 DIAGNOSIS — E871 Hypo-osmolality and hyponatremia: Secondary | ICD-10-CM | POA: Diagnosis not present

## 2022-11-23 DIAGNOSIS — F419 Anxiety disorder, unspecified: Secondary | ICD-10-CM | POA: Diagnosis not present

## 2022-11-23 DIAGNOSIS — R7303 Prediabetes: Secondary | ICD-10-CM | POA: Diagnosis not present

## 2022-11-23 DIAGNOSIS — L2082 Flexural eczema: Secondary | ICD-10-CM | POA: Diagnosis not present

## 2022-11-23 DIAGNOSIS — J302 Other seasonal allergic rhinitis: Secondary | ICD-10-CM | POA: Diagnosis not present

## 2023-01-08 DIAGNOSIS — Z0000 Encounter for general adult medical examination without abnormal findings: Secondary | ICD-10-CM | POA: Diagnosis not present

## 2023-01-08 DIAGNOSIS — E871 Hypo-osmolality and hyponatremia: Secondary | ICD-10-CM | POA: Diagnosis not present

## 2023-01-08 DIAGNOSIS — R768 Other specified abnormal immunological findings in serum: Secondary | ICD-10-CM | POA: Diagnosis not present

## 2023-01-08 DIAGNOSIS — J302 Other seasonal allergic rhinitis: Secondary | ICD-10-CM | POA: Diagnosis not present

## 2023-01-08 DIAGNOSIS — L2082 Flexural eczema: Secondary | ICD-10-CM | POA: Diagnosis not present

## 2023-01-08 DIAGNOSIS — R7303 Prediabetes: Secondary | ICD-10-CM | POA: Diagnosis not present

## 2023-01-08 DIAGNOSIS — F419 Anxiety disorder, unspecified: Secondary | ICD-10-CM | POA: Diagnosis not present

## 2023-01-08 DIAGNOSIS — Z113 Encounter for screening for infections with a predominantly sexual mode of transmission: Secondary | ICD-10-CM | POA: Diagnosis not present

## 2023-01-08 DIAGNOSIS — L0292 Furuncle, unspecified: Secondary | ICD-10-CM | POA: Diagnosis not present

## 2023-01-08 DIAGNOSIS — D508 Other iron deficiency anemias: Secondary | ICD-10-CM | POA: Diagnosis not present

## 2023-01-22 DIAGNOSIS — L2082 Flexural eczema: Secondary | ICD-10-CM | POA: Diagnosis not present

## 2023-01-22 DIAGNOSIS — D508 Other iron deficiency anemias: Secondary | ICD-10-CM | POA: Diagnosis not present

## 2023-01-22 DIAGNOSIS — R7303 Prediabetes: Secondary | ICD-10-CM | POA: Diagnosis not present

## 2023-01-22 DIAGNOSIS — J302 Other seasonal allergic rhinitis: Secondary | ICD-10-CM | POA: Diagnosis not present

## 2023-01-22 DIAGNOSIS — F419 Anxiety disorder, unspecified: Secondary | ICD-10-CM | POA: Diagnosis not present

## 2023-01-22 DIAGNOSIS — R768 Other specified abnormal immunological findings in serum: Secondary | ICD-10-CM | POA: Diagnosis not present

## 2023-01-22 DIAGNOSIS — L0292 Furuncle, unspecified: Secondary | ICD-10-CM | POA: Diagnosis not present

## 2023-01-22 DIAGNOSIS — R771 Abnormality of globulin: Secondary | ICD-10-CM | POA: Diagnosis not present

## 2023-01-26 DIAGNOSIS — R000 Tachycardia, unspecified: Secondary | ICD-10-CM | POA: Diagnosis not present

## 2023-01-26 DIAGNOSIS — R457 State of emotional shock and stress, unspecified: Secondary | ICD-10-CM | POA: Diagnosis not present

## 2023-01-26 DIAGNOSIS — F419 Anxiety disorder, unspecified: Secondary | ICD-10-CM | POA: Diagnosis not present

## 2023-01-26 DIAGNOSIS — R6889 Other general symptoms and signs: Secondary | ICD-10-CM | POA: Diagnosis not present

## 2023-01-26 DIAGNOSIS — R404 Transient alteration of awareness: Secondary | ICD-10-CM | POA: Diagnosis not present

## 2023-01-30 ENCOUNTER — Encounter: Payer: Self-pay | Admitting: Obstetrics and Gynecology

## 2023-01-30 ENCOUNTER — Other Ambulatory Visit (HOSPITAL_COMMUNITY)
Admission: RE | Admit: 2023-01-30 | Discharge: 2023-01-30 | Disposition: A | Discharge status: Home / Self Care | Payer: Medicaid Other | Source: Ambulatory Visit | Attending: Obstetrics and Gynecology | Admitting: Obstetrics and Gynecology

## 2023-01-30 ENCOUNTER — Ambulatory Visit (INDEPENDENT_AMBULATORY_CARE_PROVIDER_SITE_OTHER): Payer: Medicaid Other | Admitting: Obstetrics and Gynecology

## 2023-01-30 ENCOUNTER — Other Ambulatory Visit: Payer: Self-pay

## 2023-01-30 VITALS — BP 113/72 | HR 73 | Ht 61.0 in | Wt 246.0 lb

## 2023-01-30 DIAGNOSIS — Z3009 Encounter for other general counseling and advice on contraception: Secondary | ICD-10-CM

## 2023-01-30 DIAGNOSIS — Z01419 Encounter for gynecological examination (general) (routine) without abnormal findings: Secondary | ICD-10-CM | POA: Insufficient documentation

## 2023-01-30 NOTE — Progress Notes (Signed)
22 y.o GYN presents for AEX/PAP/STD screening

## 2023-01-30 NOTE — Patient Instructions (Addendum)
[email or web address] - most up to date & accurate information about birth control   It was nice to meet you today. You will get your results in the MyChart app within the next week. Please let us know if you have questions.

## 2023-01-30 NOTE — Progress Notes (Signed)
ANNUAL EXAM Patient name: Beth Curtis MRN [id number]  Date of birth: 2000-11-20 Chief Complaint:   NEW PATIENT/ANNUAL  History of Present Illness:   Beth Curtis is a 22 y.o. G0P0000 with Patient's last menstrual period was 01/11/2023 (exact date). being seen today for a routine annual exam.  Current complaints: None  Reports regular periods, no issues.    Upstream - 01/30/23 1401       Pregnancy Intention Screening   Does the patient want to become pregnant in the next year? No    Does the patient's partner want to become pregnant in the next year? No    Would the patient like to discuss contraceptive options today? Yes      Contraception Wrap Up   Current Method Female Condom    End Method Female Condom    Contraception Counseling Provided Yes    How was the end contraceptive method provided? N/A            The pregnancy intention screening data noted above was reviewed. Potential methods of contraception were discussed. The patient elected to proceed with Female Condom.   Last pap n/a Last mammogram: n/a - hx grandmother w/ postmenopausal breast ca Last colonoscopy: n/a - no fhx HPV vaccine: completed 2014     01/30/2023    2:08 PM 02/08/2021   10:18 AM 07/20/2020   10:05 AM 07/08/2020    2:10 PM 06/10/2020    5:08 PM  Depression screen PHQ 2/9  Decreased Interest 0 0 0 0 0  Down, Depressed, Hopeless 0 0 0 0 0  PHQ - 2 Score 0 0 0 0 0  Altered sleeping 3 0 0 0 0  Tired, decreased energy 1 0 0 0 0  Change in appetite 3 0 0 0 0  Feeling bad or failure about yourself  0 0 0 0 0  Trouble concentrating 0 0 0 0 0  Moving slowly or fidgety/restless 0 0 0 0 0  Suicidal thoughts 0 0 0 0 0  PHQ-9 Score 7 0 0 0 0  Difficult doing work/chores Not difficult at all  Not difficult at all        01/30/2023    2:09 PM  GAD 7 : Generalized Anxiety Score  Nervous, Anxious, on Edge 0  Control/stop worrying 0  Worry too much - different things 3  Trouble relaxing 0  Restless 0   Easily annoyed or irritable 0  Afraid - awful might happen 0  Total GAD 7 Score 3  Anxiety Difficulty Not difficult at all   Review of Systems:   Pertinent items are noted in HPI Denies any headaches, blurred vision, fatigue, shortness of breath, chest pain, abdominal pain, abnormal vaginal discharge/itching/odor/irritation, problems with periods, bowel movements, urination, or intercourse unless otherwise stated above. Pertinent History Reviewed:  Reviewed past medical,surgical, social and family history.  Reviewed problem list, medications and allergies. Physical Assessment:   Vitals:   01/30/23 1358 01/30/23 1400  BP: 113/72 113/72  Pulse: 73 73  Weight: 246 lb (111.6 kg)   Height: 5\' 1"  (1.549 m)   Body mass index is 46.48 kg/m.        Physical Examination:   General appearance - well appearing, and in no distress  Mental status - alert, oriented to person, place, and time  Chest - respiratory effort normal  Heart - normal peripheral perfusion  Breasts - breasts appear normal, no suspicious masses, no skin or nipple changes or axillary nodes  Abdomen - soft, nontender, nondistended, no masses or organomegaly  Pelvic - VULVA: normal appearing vulva with no masses, tenderness or lesions  VAGINA: normal appearing vagina with normal color and discharge, no lesions  CERVIX: normal appearing cervix without discharge or lesions, no CMT  Thin prep pap is done with reflex HR HPV cotesting  Chaperone present for exam  No results found for this or any previous visit (from the past 24 hour(s)).  Assessment & Plan:  1) Well-Woman Exam Mammogram: @ 21yo, or sooner if problems Colonoscopy: @ 21yo, or sooner if problems Pap: collected Gardasil: complete GC/CT: collected HIV/HCV: collected  2) Contraceptive counseling No contraindications to estrogen. Discussed pill, patch, ring, Depo, IUD, implant. Had bothersome bleeding w/ implant, doesn't want to take pill every day. Considering  options.   Labs/procedures today: Orders Placed This Encounter  Procedures   Hepatitis C Antibody   Hepatitis B Surface AntiGEN   RPR   HIV antibody (with reflex)    Meds: No orders of the defined types were placed in this encounter.  Follow-up: Return in about 1 year (around 01/30/2024) for annual exam.  Lennart Pall, MD 01/30/2023 2:36 PM

## 2023-01-31 DIAGNOSIS — R771 Abnormality of globulin: Secondary | ICD-10-CM | POA: Diagnosis not present

## 2023-01-31 LAB — HEPATITIS B SURFACE ANTIGEN: Hepatitis B Surface Ag: NEGATIVE

## 2023-01-31 LAB — RPR: RPR Ser Ql: NONREACTIVE

## 2023-01-31 LAB — HEPATITIS C ANTIBODY: Hep C Virus Ab: NONREACTIVE

## 2023-01-31 LAB — HIV ANTIBODY (ROUTINE TESTING W REFLEX): HIV Screen 4th Generation wRfx: NONREACTIVE

## 2023-02-06 LAB — CYTOLOGY - PAP
Chlamydia: NEGATIVE
Comment: NEGATIVE
Comment: NEGATIVE
Comment: NORMAL
Diagnosis: NEGATIVE
Neisseria Gonorrhea: NEGATIVE
Trichomonas: NEGATIVE

## 2023-02-24 ENCOUNTER — Ambulatory Visit (HOSPITAL_COMMUNITY): Payer: Self-pay

## 2023-03-13 DIAGNOSIS — B3731 Acute candidiasis of vulva and vagina: Secondary | ICD-10-CM | POA: Diagnosis not present

## 2023-03-13 DIAGNOSIS — L2082 Flexural eczema: Secondary | ICD-10-CM | POA: Diagnosis not present

## 2023-03-13 DIAGNOSIS — J302 Other seasonal allergic rhinitis: Secondary | ICD-10-CM | POA: Diagnosis not present

## 2023-03-13 DIAGNOSIS — F419 Anxiety disorder, unspecified: Secondary | ICD-10-CM | POA: Diagnosis not present

## 2023-03-13 DIAGNOSIS — D508 Other iron deficiency anemias: Secondary | ICD-10-CM | POA: Diagnosis not present

## 2023-03-13 DIAGNOSIS — R771 Abnormality of globulin: Secondary | ICD-10-CM | POA: Diagnosis not present

## 2023-03-13 DIAGNOSIS — R7303 Prediabetes: Secondary | ICD-10-CM | POA: Diagnosis not present

## 2023-03-18 ENCOUNTER — Inpatient Hospital Stay: Payer: Medicaid Other | Attending: Hematology and Oncology | Admitting: Hematology and Oncology

## 2023-03-18 ENCOUNTER — Encounter: Payer: Self-pay | Admitting: Hematology and Oncology

## 2023-03-18 ENCOUNTER — Inpatient Hospital Stay: Payer: Medicaid Other

## 2023-03-18 VITALS — BP 137/82 | HR 75 | Temp 99.1°F | Resp 18 | Ht 61.0 in | Wt 246.8 lb

## 2023-03-18 DIAGNOSIS — D509 Iron deficiency anemia, unspecified: Secondary | ICD-10-CM | POA: Diagnosis not present

## 2023-03-18 DIAGNOSIS — D75838 Other thrombocytosis: Secondary | ICD-10-CM | POA: Diagnosis not present

## 2023-03-18 DIAGNOSIS — R21 Rash and other nonspecific skin eruption: Secondary | ICD-10-CM | POA: Diagnosis not present

## 2023-03-18 DIAGNOSIS — L732 Hidradenitis suppurativa: Secondary | ICD-10-CM | POA: Diagnosis not present

## 2023-03-18 DIAGNOSIS — L906 Striae atrophicae: Secondary | ICD-10-CM | POA: Insufficient documentation

## 2023-03-18 NOTE — Assessment & Plan Note (Signed)
She has constellation of skin rash on her cheek and abnormal skin changes, raising the possibility of undiagnosed rheumatology good condition such as lupus I recommend rheumatology referral and she agrees

## 2023-03-18 NOTE — Progress Notes (Signed)
[facility] CONSULT NOTE  Patient Care Team: Norm Salt, [place] as PCP - General (Physician Assistant)  ASSESSMENT & PLAN:  Iron deficiency anemia She has iron deficiency anemia likely due to history of menorrhagia She is completely asymptomatic I recommend 1 iron supplement daily before dinnertime I recommend repeat labs in 3 months Since she has frequent visit with her primary care doctor, I recommend recheck blood work and iron studies in 3 months If she has persistent iron deficiency anemia, I will bring her back and give her intravenous iron infusion  Reactive thrombocytosis This is likely due to undiagnosed autoimmune condition or reactive to low iron As above, I recommend oral iron supplement  Skin rash She has constellation of skin rash on her cheek and abnormal skin changes, raising the possibility of undiagnosed rheumatology good condition such as lupus I recommend rheumatology referral and she agrees  Hydradenitis She has signs of recurrent skin infection and hidradenitis I recommend dermatology referral and she will reach out to her primary care doctor for this  Skin striae She has significant skin history likely due to chronic steroid use I recommend the patient to stop using clobetasol treatment ointment Orders Placed This Encounter  Procedures   Ambulatory referral to Rheumatology    Referral Priority:   Routine    Referral Type:   Consultation    Referral Reason:   Specialty Services Required    Requested Specialty:   Rheumatology    Number of Visits Requested:   1    All questions were answered. The patient knows to call the clinic with any problems, questions or concerns.  The total time spent in the appointment was 60 minutes encounter with patients including review of chart and various tests results, discussions about plan of care and coordination of care plan  Artis Delay, MD 7/22/20241:22 PM   CHIEF COMPLAINTS/PURPOSE OF  CONSULTATION:  Anemia & elevated serum globulin  HISTORY OF PRESENTING ILLNESS:  Beth Curtis 22 y.o. female is here because of anemia abnormal blood work I have reviewed her scan report from May The patient does not understand fully why she is being referred here On Jan 08, 2023, her blood work revealed hemoglobin of 11.6, MCV of 76.6, platelet count of 460 and low ferritin at 8.6.  The patient have history of menorrhagia but none recently.  She was prescribed oral iron supplement for some time but she has not been taking it.  She eats normal diet but does have pica with ice craving She has never donated blood or received blood transfusions  More interestingly, serum protein electrophoresis was done for some reason which show elevated serum globulin level with elevated light chains but with normal ratio She has intermittent skin changes on her hand and has seen dermatologist in the past for eczema.  She was prescribed clobetasol cream and she has not been taking it for years despite not having seen the dermatologist for some time Gradually, over the years, she has started to gain weight and has abdominal and skin striae throughout She also developed recurrent skin infection in the skin fold especially in her armpit, under her bra in her groin region She has malar skin rash that has been present for many years She has also noted some discoloration of her skin in her hands especially darkening in the interphalangeal joints but lightening of her skin in between the joints  MEDICAL HISTORY:  Past Medical History:  Diagnosis Date   Anemia  Anxiety    Asthma    Depression    Eczema    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    SURGICAL HISTORY: Past Surgical History:  Procedure Laterality Date   NO PAST SURGERIES      SOCIAL HISTORY: Social History   Socioeconomic History   Marital status: Single    Spouse name: Not on file   Number of children: 0   Years of education: Not on file    Highest education level: Not on file  Occupational History   Not on file  Tobacco Use   Smoking status: Never    Passive exposure: Yes   Smokeless tobacco: Never  Vaping Use   Vaping status: Never Used  Substance and Sexual Activity   Alcohol use: Yes   Drug use: Not Currently   Sexual activity: Yes    Birth control/protection: Condom  Other Topics Concern   Not on file  Social History Narrative   Not on file   Social Determinants of Health   Financial Resource Strain: Not on file  Food Insecurity: Not on file  Transportation Needs: Not on file  Physical Activity: Not on file  Stress: Not on file  Social Connections: Not on file  Intimate Partner Violence: Not on file    FAMILY HISTORY: Family History  Problem Relation Age of Onset   Cancer Father    Cancer Maternal Grandmother 18 - 28       breast   Cancer Maternal Grandfather    Cancer Paternal Grandmother    Cancer Paternal Grandfather     ALLERGIES:  has No Known Allergies.  MEDICATIONS:  Current Outpatient Medications  Medication Sig Dispense Refill   cetirizine (ZYRTEC) 10 MG tablet Take 1 tablet (10 mg total) by mouth daily. 90 tablet 3   clobetasol ointment (TEMOVATE) 0.05 % APPLY TOPICALLY TO THE AFFECTED AREA TWICE DAILY 90 g 1   hydrOXYzine (ATARAX/VISTARIL) 25 MG tablet TAKE 1 TABLET BY MOUTH AS NEEDED FOR ITCHING 90 tablet 0   propranolol (INDERAL) 20 MG tablet Take 20 mg by mouth 2 (two) times daily.     No current facility-administered medications for this visit.    REVIEW OF SYSTEMS:   Constitutional: Denies fevers, chills or abnormal night sweats Eyes: Denies blurriness of vision, double vision or watery eyes Ears, nose, mouth, throat, and face: Denies mucositis or sore throat Respiratory: Denies cough, dyspnea or wheezes Cardiovascular: Denies palpitation, chest discomfort or lower extremity swelling Gastrointestinal:  Denies nausea, heartburn or change in bowel habits Lymphatics: Denies  new lymphadenopathy or easy bruising Neurological:Denies numbness, tingling or new weaknesses Behavioral/Psych: Mood is stable, no new changes  All other systems were reviewed with the patient and are negative.  PHYSICAL EXAMINATION: ECOG PERFORMANCE STATUS: 1 - Symptomatic but completely ambulatory  Vitals:   03/18/23 1301  BP: 137/82  Pulse: 75  Resp: 18  Temp: 99.1 F (37.3 C)  SpO2: 100%   Filed Weights   03/18/23 1301  Weight: 246 lb 12.8 oz (111.9 kg)    GENERAL:alert, no distress and comfortable SKIN: She had rash on both cheeks, abnormal skin lesions, skin discoloration especially in her small joints in her hands.  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  She has diffuse abdominal skin striae as well as striae across her chest wall and her arm EYES: normal, conjunctiva are pink and non-injected, sclera clear OROPHARYNX:no exudate, no erythema and lips, buccal mucosa, and tongue normal  NECK: supple, thyroid normal size,  non-tender, without nodularity LYMPH:  no palpable lymphadenopathy in the cervical, axillary or inguinal LUNGS: clear to auscultation and percussion with normal breathing effort HEART: regular rate & rhythm and no murmurs and no lower extremity edema ABDOMEN:abdomen soft, non-tender and normal bowel sounds Musculoskeletal:no cyanosis of digits and no clubbing  PSYCH: alert & oriented x 3 with fluent speech NEURO: no focal motor/sensory deficits

## 2023-03-18 NOTE — Assessment & Plan Note (Signed)
She has signs of recurrent skin infection and hidradenitis I recommend dermatology referral and she will reach out to her primary care doctor for this

## 2023-03-18 NOTE — Assessment & Plan Note (Signed)
She has significant skin history likely due to chronic steroid use I recommend the patient to stop using clobetasol treatment ointment

## 2023-03-18 NOTE — Assessment & Plan Note (Signed)
This is likely due to undiagnosed autoimmune condition or reactive to low iron As above, I recommend oral iron supplement

## 2023-03-18 NOTE — Assessment & Plan Note (Signed)
She has iron deficiency anemia likely due to history of menorrhagia She is completely asymptomatic I recommend 1 iron supplement daily before dinnertime I recommend repeat labs in 3 months Since she has frequent visit with her primary care doctor, I recommend recheck blood work and iron studies in 3 months If she has persistent iron deficiency anemia, I will bring her back and give her intravenous iron infusion

## 2023-03-25 DIAGNOSIS — R771 Abnormality of globulin: Secondary | ICD-10-CM | POA: Diagnosis not present

## 2023-03-25 DIAGNOSIS — J302 Other seasonal allergic rhinitis: Secondary | ICD-10-CM | POA: Diagnosis not present

## 2023-03-25 DIAGNOSIS — R7303 Prediabetes: Secondary | ICD-10-CM | POA: Diagnosis not present

## 2023-03-25 DIAGNOSIS — L2082 Flexural eczema: Secondary | ICD-10-CM | POA: Diagnosis not present

## 2023-03-25 DIAGNOSIS — F419 Anxiety disorder, unspecified: Secondary | ICD-10-CM | POA: Diagnosis not present

## 2023-03-25 DIAGNOSIS — D508 Other iron deficiency anemias: Secondary | ICD-10-CM | POA: Diagnosis not present

## 2023-04-03 ENCOUNTER — Ambulatory Visit (HOSPITAL_COMMUNITY)
Admission: EM | Admit: 2023-04-03 | Discharge: 2023-04-03 | Disposition: A | Discharge status: Home / Self Care | Payer: Medicaid Other | Attending: Emergency Medicine | Admitting: Emergency Medicine

## 2023-04-03 ENCOUNTER — Encounter (HOSPITAL_COMMUNITY): Payer: Self-pay

## 2023-04-03 DIAGNOSIS — L0231 Cutaneous abscess of buttock: Secondary | ICD-10-CM

## 2023-04-03 MED ORDER — FLUCONAZOLE 150 MG PO TABS: ORAL_TABLET | ORAL | 0 refills | Status: DC

## 2023-04-03 MED ORDER — SULFAMETHOXAZOLE-TRIMETHOPRIM 800-160 MG PO TABS: 1.0000 | ORAL_TABLET | Freq: Two times a day (BID) | ORAL | 0 refills | Status: AC

## 2023-04-03 MED ORDER — CHLORHEXIDINE GLUCONATE 4 % EX SOLN: Freq: Every day | CUTANEOUS | 0 refills | Status: DC | PRN

## 2023-04-03 NOTE — Discharge Instructions (Addendum)
Please start taking antibiotics today, take them with food and until finished.  I have sent you in Diflucan to prevent against vaginal yeast infections.  Please do warm compresses 2-3 times daily with warm water and the Hibiclens liquid.  For pain and discomfort you can take 800 mg of ibuprofen every 8 hours.   Return to clinic if you develop any fever, nausea, vomiting, or no improvement in your abscess.

## 2023-04-03 NOTE — ED Provider Notes (Signed)
[facility]    CSN: [id number] Arrival date & time: 04/03/23  0803      History   Chief Complaint Chief Complaint  Patient presents with   Abscess    HPI Beth Curtis is a 22 y.o. female.   Patient presents to clinic for an abscess to her buttocks that has been present for the past week.  At home she has tried warm compresses and boil-ease.  She did take an over-the-counter medication this morning for her pain.  Reports she has had similar abscesses in the past that have required incision and drainage.  She denies any fever, denies nausea or vomiting.  No current drainage.     The history is provided by the patient and medical records.  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 Authorizing Provider  chlorhexidine (HIBICLENS) 4 % external liquid Apply topically daily as needed. 04/03/23  Yes Rinaldo Ratel, [place] N, FNP  fluconazole (DIFLUCAN) 150 MG tablet Take 1 tablet on  day 3 of antibiotics and 1 tablet on the last day of antibiotics to help prevent against vaginal yeast infection. 04/03/23  Yes Rinaldo Ratel, [place] N, FNP  sulfamethoxazole-trimethoprim (BACTRIM DS) 800-160 MG tablet Take 1 tablet by mouth 2 (two) times daily for 7 days. 04/03/23 04/10/23 Yes Rinaldo Ratel, [place] N, FNP  cetirizine (ZYRTEC) 10 MG tablet Take 1 tablet (10 mg total) by mouth daily. 05/06/20   Dana Allan, MD  hydrOXYzine (ATARAX/VISTARIL) 25 MG tablet TAKE 1 TABLET BY MOUTH AS NEEDED FOR ITCHING 11/30/20   Maness, Loistine Chance, MD  propranolol (INDERAL) 20 MG tablet Take 20 mg by mouth 2 (two) times daily. 04/28/21   [provider]    Family History Family History  Problem Relation Age of Onset   Cancer Father    Cancer Maternal Grandmother 72 - 41       breast   Cancer Maternal Grandfather    Cancer Paternal Grandmother    Cancer Paternal Grandfather     Social History Social History   Tobacco Use   Smoking status: Never    Passive exposure: Yes   Smokeless tobacco: Never  Vaping Use   Vaping  status: Never Used  Substance Use Topics   Alcohol use: Yes   Drug use: Not Currently     Allergies   Patient has no known allergies.   Review of Systems Review of Systems  Constitutional:  Negative for fever.  Gastrointestinal:  Negative for nausea and vomiting.     Physical Exam Triage Vital Signs ED Triage Vitals  Encounter Vitals Group     BP 04/03/23 0820 110/84     Systolic BP Percentile --      Diastolic BP Percentile --      Pulse Rate 04/03/23 0820 (!) 112     Resp 04/03/23 0820 16     Temp 04/03/23 0820 98.6 F (37 C)     Temp Source 04/03/23 0820 Oral     SpO2 04/03/23 0820 98 %     Weight --      Height --      Head Circumference --      Peak Flow --      Pain Score 04/03/23 0823 5     Pain Loc --      Pain Education --      Exclude from Growth Chart --    No data found.  Updated Vital Signs BP 110/84 (BP Location: Right Arm)   Pulse (!) 112    Temp 98.6 F (37 C) (Oral)   Resp 16   LMP 03/30/2023   SpO2 98%   Visual Acuity Right Eye Distance:   Left Eye Distance:   Bilateral Distance:    Right Eye Near:   Left Eye Near:    Bilateral Near:     Physical Exam Vitals and nursing note reviewed.  Constitutional:      Appearance: Normal appearance.  HENT:     Head: Normocephalic and atraumatic.     Right Ear: External ear normal.     Left Ear: External ear normal.     Nose: Nose normal.     Mouth/Throat:     Mouth: Mucous membranes are moist.  Eyes:     Conjunctiva/sclera: Conjunctivae normal.  Pulmonary:     Effort: Pulmonary effort is normal. No respiratory distress.  Musculoskeletal:        General: Normal range of motion.  Skin:    General: Skin is warm and dry.     Findings: Abscess present.          Comments: Draining abscess to right inner gluteal fold.  Neurological:     General: No focal deficit present.     Mental Status: She is alert and oriented to person, place, and time.  Psychiatric:        Mood and Affect: Mood normal.        Behavior: Behavior normal. Behavior is cooperative.      UC Treatments / Results  Labs (all labs ordered are listed, but only abnormal results are displayed) Labs Reviewed - No data to display  EKG   Radiology No results found.  Procedures Procedures (including critical care time)  Medications Ordered in UC Medications - No data to display  Initial Impression / Assessment and Plan / UC Course  I have reviewed the triage vital signs and the nursing notes.  Pertinent labs & imaging results that were available during my care of the patient were reviewed by me and considered in my medical decision making (see chart for details).  Vitals and triage reviewed, patient is hemodynamically stable.  Chaperone present for exam of the gluteal area,  abscess is currently draining bloody, purulent fluid.  Does not appear to have systemic illness, without fever, nausea  or vomiting.  Pressure applied and more drainage expressed.  Will place on Bactrim, encouraged to continue warm compresses.  Plan of care, follow-up care and return precautions given, no questions at this time.     Final Clinical Impressions(s) / UC Diagnoses   Final diagnoses:  Abscess of buttock, right     Discharge Instructions      Please start taking antibiotics today, take them with food and until finished.  I have sent you in Diflucan to prevent against vaginal yeast infections.  Please do warm compresses 2-3 times daily with warm water and the Hibiclens liquid.  For pain and discomfort you can take 800 mg of ibuprofen every 8 hours.   Return to clinic if you develop any fever, nausea, vomiting, or no improvement in your abscess.     ED Prescriptions     Medication Sig Dispense Auth. Provider   sulfamethoxazole-trimethoprim (BACTRIM DS) 800-160 MG tablet Take 1 tablet by mouth 2 (two) times daily for 7 days. 14 tablet Rinaldo Ratel, [place] N, [place]   fluconazole (DIFLUCAN) 150 MG tablet Take 1 tablet on day 3 of antibiotics and 1 tablet on the last day of antibiotics to help prevent against vaginal yeast infection. 2 tablet Rinaldo Ratel, [place] N, FNP   chlorhexidine (HIBICLENS) 4 % external liquid Apply topically daily as needed. 118 mL Felita Bump, [place] N, [place]      PDMP not reviewed this encounter.   Rinaldo Ratel [place] N, [place] 04/03/23 [phone]

## 2023-04-03 NOTE — ED Triage Notes (Addendum)
Patient reports that she has an abscess tot he inner portion of her right buttock. Patient states she has been doing warm compresses and an OTC boil-eze. Patient states she took OTC "pain relief" 3 tabs at 0100 this AM.

## 2023-06-06 DIAGNOSIS — M25572 Pain in left ankle and joints of left foot: Secondary | ICD-10-CM | POA: Diagnosis not present

## 2023-06-06 DIAGNOSIS — M25571 Pain in right ankle and joints of right foot: Secondary | ICD-10-CM | POA: Diagnosis not present

## 2023-06-14 DIAGNOSIS — L2082 Flexural eczema: Secondary | ICD-10-CM | POA: Diagnosis not present

## 2023-07-01 DIAGNOSIS — Z23 Encounter for immunization: Secondary | ICD-10-CM | POA: Diagnosis not present

## 2023-08-02 NOTE — Progress Notes (Signed)
Office Visit Note  Patient: Beth Curtis             Date of Birth: 2000/10/24           MRN: [id number]             PCP: Norm Salt, PA Referring: Artis Delay, MD Visit Date: 08/16/2023 Occupation: @GUAROCC @  Subjective:  Rash   History of Present Illness: Beth Curtis is a 22 y.o. female seen in consultation per request of Dr. Bertis Ruddy for the evaluation of recurrent rash.  According the patient she was diagnosed with eczema when she was a baby.  She left with eczema all her life.  When she was in high school her rash got worse.  She states she had rash all over her body.  She recalls seeing a dermatologist who diagnosed her with eczema.  She was given topical agents and oral agents which did not work.  She recalls being on some shots.  Then the dermatologist moved and she lost the follow-up.  She states last year she developed hidradenitis suppurativa in the pubic region by the hematologist.  She is not using any agents.  She gives history of occasional joint pain in her elbows.  Patient reports in August she developed pain and swelling in her left ankle for which she was seen at urgent care.  She was given a brace for couple of months and then eventually the symptoms resolved.  She does not recall if having injury.  She states she has had intermittent discomfort in her left ankle since then.  She also notices discoloration in her toes especially when they are cold.  There is no history of oral ulcers, nasal ulcers, malar rash, photosensitivity, lymphadenopathy.  There is no family history of autoimmune disease.  She is gravida 0.  She is not using any contraceptions and is not sexually active.  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  She enjoys reading.  She walks for exercise.    Activities of Daily Living:  Patient reports morning stiffness for 0 minutes.   Patient Denies nocturnal pain.  Difficulty dressing/grooming: Denies Difficulty climbing stairs:  Denies Difficulty getting out of chair: Denies Difficulty using hands for taps, buttons, cutlery, and/or writing: Denies  Review of Systems  Constitutional:  Negative for fatigue.  HENT:  Negative for mouth sores and mouth dryness.   Eyes:  Negative for dryness.  Respiratory:  Negative for shortness of breath.   Cardiovascular:  Negative for chest pain and palpitations.  Gastrointestinal:  Negative for blood in stool, constipation and diarrhea.  Endocrine: Negative for increased urination.  Genitourinary:  Negative for involuntary urination.  Musculoskeletal:  Positive for joint pain and joint pain. Negative for gait problem, joint swelling, myalgias, muscle weakness, morning stiffness, muscle tenderness and myalgias.  Skin:  Positive for color change. Negative for rash, hair loss and sensitivity to sunlight.  Allergic/Immunologic: Negative for susceptible to infections.  Neurological:  Negative for dizziness and headaches.  Hematological:  Negative for swollen glands.  Psychiatric/Behavioral:  Negative for depressed mood and sleep disturbance. The patient is not nervous/anxious.   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   Skin rash 07/04/2012   Allergic rhinitis 10/24/2006    Past Medical History:  Diagnosis Date   Anemia    Anxiety    Asthma    Depression    Eczema    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    Family History  Problem Relation Age of Onset   Cancer Father    Cancer Maternal Grandmother 36 - 69       breast   Cancer Maternal Grandfather    Cancer Paternal Grandmother     Cancer Paternal Grandfather    Past Surgical History:  Procedure Laterality Date   NO PAST SURGERIES     Social History   Social History Narrative   Not on file   Immunization History  Administered Date(s) Administered   HPV Quadrivalent 03/03/2013, 04/30/2013, 09/16/2013   Hepatitis A 04/24/2007   Meningococcal B Recombinant 08/10/2019   Meningococcal Conjugate 03/03/2013   PFIZER(Purple Top)SARS-COV-2 Vaccination 11/26/2019, 12/21/2019   Tdap 03/03/2013     Objective: Vital Signs: BP 139/85 (BP Location: Right Arm, Patient Position: Sitting, Cuff Size: Large)   Pulse 91   Resp 13   Ht 5' 1.5" (1.562 m)   Wt 240 lb 9.6 oz (109.1 kg)   BMI 44.72 kg/m    Physical Exam Vitals and nursing note reviewed.  Constitutional:      Appearance: She is well-developed.  HENT:     Head: Normocephalic and atraumatic.  Eyes:     Conjunctiva/sclera: Conjunctivae normal.  Cardiovascular:     Rate and Rhythm: Normal rate and regular rhythm.     Heart sounds: Normal heart sounds.  Pulmonary:     Effort: Pulmonary effort is normal.     Breath sounds: Normal breath sounds.  Abdominal:     General: Bowel sounds are normal.     Palpations: Abdomen is soft.  Musculoskeletal:     Cervical back: Normal range of motion.  Lymphadenopathy:     Cervical: No cervical adenopathy.  Skin:    General: Skin is warm and dry.     Capillary Refill: Capillary refill takes less than 2 seconds.     Comments: Patient had capillary refill with no nailbed capillary changes.  Hyperpigmented plaques were noted on the flexor surface of the elbows and her hands.  Neurological:     Mental Status: She is alert and oriented to person, place, and time.  Psychiatric:        Behavior: Behavior normal.      Musculoskeletal Exam: Cervical, thoracic and lumbar spine 1 good range of motion.  Shoulder joints, elbow joints, wrist joints, MCPs PIPs and DIPs with good range of motion with no synovitis.  Hip joints  and knee joints with good range of motion.  There was no tenderness over ankles, MTPs or PIPs.  CDAI Exam: CDAI Score: -- Patient Global: --; Provider Global: -- Swollen: --; Tender: -- Joint Exam 08/16/2023   No joint exam has been documented for this visit   There is currently no information documented on the homunculus. Go to the Rheumatology activity and complete the homunculus joint exam.  Investigation: No additional findings.  Imaging: No results found.  Recent Labs: Lab Results  Component Value Date   WBC 10.7 07/20/2020   HGB 10.0 (L) 07/20/2020   PLT 574 (H) 07/20/2020   January 30, 2023 hepatitis B negative, hepatitis C nonreactive, RPR nonreactive, HIV nonreactive  Speciality Comments: No specialty comments available.  Procedures:  No procedures performed Allergies: Patient has no known allergies.   Assessment / Plan:     Visit Diagnoses: Skin rash -patient reports that she was diagnosed with eczema as a child.  She has had extensive eczema since then.  She was under care of a dermatologist and high school and was treated with topical agents and oral agents but did not work.  As she was given some form of injections which helped her with the rash and then the dermatologist moved.  She has not had a dermatologist since then.  She continues to have eczema rash all over her body.  She states recently she was also diagnosed with head neck to superior in the pubic region.  She has an appointment coming up with the dermatologist in January.- Plan: CK, Angiotensin converting enzyme  Raynaud's phenomenon without gangrene -she notices discoloration in her toes when she is exposed to the colder temperatures.  She had good capillary refill with no nailbed capillary changes.  No sclerodactyly was noted.  Plan: ANA, C3 and C4, Anti-DNA antibody, double-stranded, Sjogrens syndrome-B extractable nuclear antibody, Sjogrens syndrome-A extractable nuclear antibody, Anti-Smith antibody, RNP  Antibody, Anti-scleroderma antibody, Beta-2 glycoprotein antibodies, Cardiolipin antibodies, IgG, IgM, IgA  Chronic pain of left ankle -patient had an episode of left ankle joint swelling in August which lasted for couple of months.  Patient states she was in a brace.  She does not recall any injury.  She has had intermittent discomfort in her left ankle joint since then.  No synovitis was noted.  Plan: XR Ankle 2 Views Left, x-rays of the ankle joint were unremarkable.  Sedimentation rate, Rheumatoid factor, Cyclic citrul peptide antibody, IgG  Reactive thrombocytosis-patient has chronic anemia and reactive thrombocytosis.  She has been evaluated by hematology.  Hydradenitis suppurativa-in the pubic region per patient.  She has an appointment coming up with the dermatologist.  Other eczema-according to patient eczema was diagnosed when she was an infant.  She has extensive hyperpigmentation and hypopigmented lesions.  She also had active eczema on the flexor surface of her elbows and her palms.  Patient is currently on prednisone 20 mg p.o. daily.  Patient states it was prescribed by her PCP for the treatment of severe eczema  Other medical problems are listed as follows:  H/O chlamydia infection  Breakthrough bleeding on Nexplanon  History of iron deficiency anemia  Prediabetes  Exercise-induced asthma  Anxiety and depression  Orders: Orders Placed This Encounter  Procedures   XR Ankle 2 Views Left   CK   Sedimentation rate   Rheumatoid factor   ANA   Cyclic citrul peptide antibody, IgG   C3 and C4   Anti-DNA antibody, double-stranded   Sjogrens syndrome-B extractable nuclear antibody   Sjogrens syndrome-A extractable nuclear antibody   Anti-Smith antibody   RNP Antibody   Anti-scleroderma antibody   Beta-2 glycoprotein antibodies   Cardiolipin antibodies, IgG, IgM, IgA   Angiotensin converting enzyme   No orders of the defined types were placed in this  encounter.    Follow-Up Instructions: Return for Joint pain, rash, Raynauds.   Pollyann Savoy, MD  Note - This record has been created using Animal nutritionist.  Chart creation errors have been sought, but may not always  have been located. Such creation errors do not reflect on  the standard of medical care.

## 2023-08-16 ENCOUNTER — Ambulatory Visit (INDEPENDENT_AMBULATORY_CARE_PROVIDER_SITE_OTHER): Payer: Medicaid Other

## 2023-08-16 ENCOUNTER — Ambulatory Visit: Payer: Medicaid Other | Attending: Rheumatology | Admitting: Rheumatology

## 2023-08-16 ENCOUNTER — Encounter: Payer: Self-pay | Admitting: Rheumatology

## 2023-08-16 VITALS — BP 139/85 | HR 91 | Resp 13 | Ht 61.5 in | Wt 240.6 lb

## 2023-08-16 DIAGNOSIS — D75838 Other thrombocytosis: Secondary | ICD-10-CM | POA: Diagnosis not present

## 2023-08-16 DIAGNOSIS — F32A Depression, unspecified: Secondary | ICD-10-CM

## 2023-08-16 DIAGNOSIS — L308 Other specified dermatitis: Secondary | ICD-10-CM | POA: Diagnosis not present

## 2023-08-16 DIAGNOSIS — G8929 Other chronic pain: Secondary | ICD-10-CM

## 2023-08-16 DIAGNOSIS — R21 Rash and other nonspecific skin eruption: Secondary | ICD-10-CM

## 2023-08-16 DIAGNOSIS — J45990 Exercise induced bronchospasm: Secondary | ICD-10-CM | POA: Diagnosis not present

## 2023-08-16 DIAGNOSIS — R7303 Prediabetes: Secondary | ICD-10-CM | POA: Diagnosis not present

## 2023-08-16 DIAGNOSIS — F419 Anxiety disorder, unspecified: Secondary | ICD-10-CM

## 2023-08-16 DIAGNOSIS — N921 Excessive and frequent menstruation with irregular cycle: Secondary | ICD-10-CM

## 2023-08-16 DIAGNOSIS — Z862 Personal history of diseases of the blood and blood-forming organs and certain disorders involving the immune mechanism: Secondary | ICD-10-CM | POA: Diagnosis not present

## 2023-08-16 DIAGNOSIS — M25572 Pain in left ankle and joints of left foot: Secondary | ICD-10-CM | POA: Diagnosis not present

## 2023-08-16 DIAGNOSIS — Z8619 Personal history of other infectious and parasitic diseases: Secondary | ICD-10-CM

## 2023-08-16 DIAGNOSIS — I7300 Raynaud's syndrome without gangrene: Secondary | ICD-10-CM

## 2023-08-16 DIAGNOSIS — Z975 Presence of (intrauterine) contraceptive device: Secondary | ICD-10-CM

## 2023-08-16 DIAGNOSIS — L732 Hidradenitis suppurativa: Secondary | ICD-10-CM

## 2023-08-18 NOTE — Progress Notes (Signed)
Sedimentation rate is elevated at 121.  CK normal, ANA is low titer and not significant, ENA panel negative, complements normal.  Anticardiolipin pending, beta-2 GP 1 pending, anti-CCP pending, ACE pending.

## 2023-08-20 LAB — ANTI-NUCLEAR AB-TITER (ANA TITER): ANA Titer 1: 1:40 {titer} — ABNORMAL HIGH

## 2023-08-20 LAB — C3 AND C4
C3 Complement: 197 mg/dL — ABNORMAL HIGH (ref 83–193)
C4 Complement: 28 mg/dL (ref 15–57)

## 2023-08-20 LAB — BETA-2 GLYCOPROTEIN ANTIBODIES
Beta-2 Glyco 1 IgA: <2 U/mL (ref <20.0)
Beta-2 Glyco 1 IgM: <2 U/mL (ref <20.0)
Beta-2 Glyco I IgG: <2 U/mL (ref <20.0)

## 2023-08-20 LAB — ANTI-SCLERODERMA ANTIBODY: Scleroderma (Scl-70) (ENA) Antibody, IgG: <1 AI

## 2023-08-20 LAB — CK: Total CK: 48 U/L (ref 29–143)

## 2023-08-20 LAB — ANA: Anti Nuclear Antibody (ANA): POSITIVE — AB

## 2023-08-20 LAB — CARDIOLIPIN ANTIBODIES, IGG, IGM, IGA
Anticardiolipin IgA: <2 [APL'U]/mL (ref <20.0)
Anticardiolipin IgG: <2 [GPL'U]/mL (ref <20.0)
Anticardiolipin IgM: <2 [MPL'U]/mL (ref <20.0)

## 2023-08-20 LAB — SEDIMENTATION RATE: Sed Rate: 121 mm/h — ABNORMAL HIGH (ref 0–20)

## 2023-08-20 LAB — RNP ANTIBODY: Ribonucleic Protein(ENA) Antibody, IgG: <1 AI

## 2023-08-20 LAB — SJOGRENS SYNDROME-A EXTRACTABLE NUCLEAR ANTIBODY: SSA (Ro) (ENA) Antibody, IgG: <1 AI

## 2023-08-20 LAB — ANGIOTENSIN CONVERTING ENZYME: Angiotensin-Converting Enzyme: 18 U/L (ref 9–67)

## 2023-08-20 LAB — ANTI-SMITH ANTIBODY: ENA SM Ab Ser-aCnc: <1 AI

## 2023-08-20 LAB — SJOGRENS SYNDROME-B EXTRACTABLE NUCLEAR ANTIBODY: SSB (La) (ENA) Antibody, IgG: <1 AI

## 2023-08-20 LAB — ANTI-DNA ANTIBODY, DOUBLE-STRANDED: ds DNA Ab: <1 [IU]/mL

## 2023-08-20 LAB — RHEUMATOID FACTOR: Rheumatoid fact SerPl-aCnc: <10 [IU]/mL (ref <14)

## 2023-08-20 LAB — CYCLIC CITRUL PEPTIDE ANTIBODY, IGG: Cyclic Citrullin Peptide Ab: <16 U

## 2023-08-21 NOTE — Progress Notes (Signed)
Beta-2 GP 1 negative, anticardiolipin negative, ACE normal

## 2023-08-30 NOTE — Progress Notes (Signed)
Office Visit Note  Patient: Beth Curtis             Date of Birth: 11/22/2000           MRN: [id number]             PCP: Norm Salt, PA Referring: Norm Salt, [place] Visit Date: 09/13/2023 Occupation: @GUAROCC @  Subjective:  Positive ANA and rash  History of Present Illness: Beth Curtis is a 23 y.o. female returns today after her initial visit to on August 16, 2023.  Patient states that She was started on Dupixent by her dermatologist which is clearing up the rash.  She is trying to get approval for Dupixent through her insurance.  She still have some residual rash on her neck and her trunk.  Rash on her hands has improved.  She has not had much issues with the Raynauds now.  She states that her left ankle pain has resolved and she has not had any swelling or discomfort recently.  She is following up with the dermatologist regarding hidradenitis suppurativa.    Activities of Daily Living:  Patient reports morning stiffness for denies .   Patient Denies nocturnal pain.  Difficulty dressing/grooming: Denies Difficulty climbing stairs: Denies Difficulty getting out of chair: Denies Difficulty using hands for taps, buttons, cutlery, and/or writing: Denies  Review of Systems  Constitutional:  Negative for fatigue.  HENT:  Negative for mouth sores and mouth dryness.   Eyes:  Negative for dryness.  Respiratory:  Negative for shortness of breath.   Cardiovascular:  Negative for chest pain and palpitations.  Gastrointestinal:  Negative for blood in stool, constipation and diarrhea.  Endocrine: Negative for increased urination.  Genitourinary:  Negative for involuntary urination.  Musculoskeletal:  Negative for joint pain, gait problem, joint pain, joint swelling, myalgias, muscle weakness, morning stiffness, muscle tenderness and myalgias.  Skin:  Negative for color change, rash, hair loss and sensitivity to sunlight.  Allergic/Immunologic: Negative for susceptible to  infections.  Neurological:  Negative for dizziness and headaches.  Hematological:  Negative for swollen glands.  Psychiatric/Behavioral:  Negative for depressed mood and sleep disturbance. The patient is not nervous/anxious.     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   Skin rash 07/04/2012   Allergic rhinitis 10/24/2006    Past Medical History:  Diagnosis Date   Anemia    Anxiety    Asthma    Depression    Eczema    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    Family History  Problem Relation Age of Onset   Cancer Father    Cancer Maternal Grandmother 26 - 69       breast   Cancer Maternal Grandfather    Cancer Paternal Grandmother    Cancer Paternal Grandfather    Past Surgical History:  Procedure Laterality Date   NO PAST SURGERIES     Social History   Social History Narrative   Not on file   Immunization History  Administered Date(s) Administered   HPV Quadrivalent 03/03/2013, 04/30/2013, 09/16/2013   Hepatitis A 04/24/2007   Meningococcal B Recombinant 08/10/2019   Meningococcal Conjugate  03/03/2013   PFIZER(Purple Top)SARS-COV-2 Vaccination 11/26/2019, 12/21/2019   Tdap 03/03/2013     Objective: Vital Signs: BP 126/85 (BP Location: Right Arm, Patient Position: Sitting, Cuff Size: Large)   Pulse 94   Ht 5\' 1"  (1.549 m)   Wt 246 lb (111.6 kg)   BMI 46.48 kg/m    Physical Exam Vitals and nursing note reviewed.  Constitutional:      Appearance: She is well-developed.  HENT:     Head: Normocephalic and atraumatic.  Eyes:     Conjunctiva/sclera: Conjunctivae normal.   Cardiovascular:     Rate and Rhythm: Normal rate and regular rhythm.     Heart sounds: Normal heart sounds.  Pulmonary:     Effort: Pulmonary effort is normal.     Breath sounds: Normal breath sounds.  Abdominal:     General: Bowel sounds are normal.     Palpations: Abdomen is soft.  Musculoskeletal:     Cervical back: Normal range of motion.  Lymphadenopathy:     Cervical: No cervical adenopathy.  Skin:    General: Skin is warm and dry.     Capillary Refill: Capillary refill takes less than 2 seconds.     Comments: Hyperpigmented plaques were noted on the neckline, flexor aspect of her elbows, lower abdominal region.  Dry scales were noted on her hands and wrist.  Neurological:     Mental Status: She is alert and oriented to person, place, and time.  Psychiatric:        Behavior: Behavior normal.      Musculoskeletal Exam: Cervical, thoracic and lumbar spine 1 good range of motion.  Shoulder joints, elbow joints, wrist joints, MCPs PIPs and DIPs Jueng[place] range of motion with no synovitis.  Hip joints, knee joints, ankles, MTPs and PIPs were in good range of motion with no synovitis.  CDAI Exam: CDAI Score: -- Patient Global: --; Provider Global: -- Swollen: --; Tender: -- Joint Exam 09/13/2023   No joint exam has been documented for this visit   There is currently no information documented on the homunculus. Go to the Rheumatology activity and complete the homunculus joint exam.  Investigation: No additional findings.  Imaging: XR Ankle 2 Views Left Result Date: 08/16/2023 No tibiotalar or subtalar joint space narrowing was noted.  No erosive changes were noted. Impression: Unremarkable x-rays of the ankle.   Recent Labs: Lab Results  Component Value Date   WBC 10.7 07/20/2020   HGB 10.0 (L) 07/20/2020   PLT 574 (H) 07/20/2020     January 30, 2023 hepatitis B negative, hepatitis C nonreactive, RPR nonreactive, HIV nonreactive   Speciality Comments: If sed rate is  elevated refer to hematology/oncology.  Procedures:  No procedures performed Allergies: Patient has no known allergies.   Assessment / Plan:     Visit Diagnoses: Skin rash - Extensive rash consistent with the eczema all over her body.  Patient was evaluated by Dr. Onalee Hua.  Patient was given Dupixent samples which were very effective.  Patient is waiting for approval from the insurance.  She was also advised to use tacrolimus ointment mixed with CeraVe.  Other eczema - Diagnosed with eczema in childhood.  Patient was on prednisone for eczema at the last visit.  Chronic pain of left ankle - History of one episode of swelling in August which lasted for couple of months.  Intermittent discomfort in her ankle since then.  X-rays were unremarkable. August 16, 2023 ANA 1: 40 NS, ENA (dsDNA, SSA, [place], Katrinka Blazing,  RNP, SCL 70) negative, C3-C4 normal, anticardiolipin negative, beta-2 GP 1 negative, ESR 121, RF negative, CCP negative, CK 41, ACE 18 All autoimmune labs were negative.  Lab results were discussed with the patient.  Elevated sed rate -I am uncertain about the etiology of elevated sedimentation rate.  It could be due to hidradenitis suppurativa or eczema.  Patient denies any history of infection.  I will repeat test today.  I will also forward results to Dr. Onalee Hua.  Plan: Sedimentation rate, Serum protein electrophoresis with reflex, C-reactive protein.  If her sed rate remains elevated I will refer her to hematology for evaluation.  Raynaud's phenomenon without gangrene-patient gives history of gout with bluish discoloration in her toes at times.  She has not had any recent episodes.  Autoimmune workup was negative.  She had good capillary refill with no sclerodactyly.  Hydradenitis suppurativa- Pubic per patient.  Patient did not discuss treatment with Dr. Onalee Hua.  I advised her to discuss this at the follow-up visit.  Other medical problems are listed as follows:  Reactive  thrombocytosis  History of iron deficiency anemia  Exercise-induced asthma  Prediabetes  Breakthrough bleeding on Nexplanon  H/O chlamydia infection  Anxiety and depression  Orders: Orders Placed This Encounter  Procedures   Sedimentation rate   Serum protein electrophoresis with reflex   C-reactive protein   No orders of the defined types were placed in this encounter.    Follow-Up Instructions: Return if symptoms worsen or fail to improve, for elevated sed rate.   Pollyann Savoy, MD  Note - This record has been created using Animal nutritionist.  Chart creation errors have been sought, but may not always  have been located. Such creation errors do not reflect on  the standard of medical care.

## 2023-09-02 ENCOUNTER — Encounter: Payer: Self-pay | Admitting: Dermatology

## 2023-09-02 ENCOUNTER — Ambulatory Visit: Payer: Medicaid Other | Admitting: Dermatology

## 2023-09-02 VITALS — BP 125/81 | HR 83

## 2023-09-02 DIAGNOSIS — L2089 Other atopic dermatitis: Secondary | ICD-10-CM

## 2023-09-02 DIAGNOSIS — L906 Striae atrophicae: Secondary | ICD-10-CM | POA: Diagnosis not present

## 2023-09-02 DIAGNOSIS — L309 Dermatitis, unspecified: Secondary | ICD-10-CM

## 2023-09-02 MED ORDER — DUPIXENT 300 MG/2ML ~~LOC~~ SOAJ: 300.0000 mg | SUBCUTANEOUS | 11 refills | Status: DC

## 2023-09-02 MED ORDER — DUPILUMAB 300 MG/2ML ~~LOC~~ SOSY
600.0000 mg | PREFILLED_SYRINGE | Freq: Once | SUBCUTANEOUS | Status: AC
  Administered 2023-09-02: 600 mg via SUBCUTANEOUS

## 2023-09-02 MED ORDER — TACROLIMUS 0.1 % EX OINT: TOPICAL_OINTMENT | CUTANEOUS | 5 refills | Status: DC

## 2023-09-02 NOTE — Progress Notes (Signed)
 New Patient Visit   Subjective  Beth Curtis is a 23 y.o. female who presents for the following: eczema Pt has eczema since infancy that she'd like to treat. She had been using clobetasol  oint for 86yrs everyday but stopped in June when hematologist told her she shouldn't be using it long term. She uses aquaphor daily for it. She was given prednisone  to use for 15 day increments with flare Pt has growth in her groin that stays the same but doesn't resolve for about 28yr. She doesn't do anything for that    The following portions of the chart were reviewed this encounter and updated as appropriate: medications, allergies, medical history  Review of Systems:  No other skin or systemic complaints except as noted in HPI or Assessment and Plan.  Objective  Well appearing patient in no apparent distress; mood and affect are within normal limits.  A focused examination was performed of the following areas: Groin, arms and legs  Relevant exam findings are noted in the Assessment and Plan.    Assessment & Plan   1. Eczema - Assessment: Patient has a long-standing history of eczema covering approximately 80% of body surface area, with an IgA score of 3. Previous treatments, including intermittent oral prednisone  and topical steroids (clobetasol ,), were found ineffective for long-term management. Reports daytime itching, primarily when hot. - Plan:    - Discontinue topical steroids.   - Initiate Dupixent  (dupilumab ) therapy:     - Start with a 600mg  loading dose (2 injections).  Sample injected today because pt will be traveling back to school in 1 week     - Continue with a maintenance dose every 2 weeks.     - Prescription sent for maintenance dose with 10 refills.     - Patient to sign form for assistance program if needed.   - Apply tacrolimus  ointment (80g tube) mixed with CeraVe anti-itch cream (pramoxine) to affected areas.   - Continue current shower routine with warm water only.    - Take oatmeal baths 1-2 times per week.   - Apply Aquaphor after baths.   - Follow up for Dupixent  injection teaching and initiation once approved.   - Subsequent follow-up during patient's spring break.  Plan: Dupixent  Initiation Indications:  Patient isn't a candidate for systemic therapy with methotrexate or cyclosporine . Patient has been unresponsive to aggressive topical therapy and has had bad secondary side effect of striae as a result.  Failed Treatments: Topical Steroids and Topical Protopic   Treatment Protocol: 600 mg [place] day 0 then 300 mg [place] every other week  Specific Contraindications Cyclosporine  is contraindicated because the patient will not be able to complete the necessary follow-up labs. Methotrexate is contraindicated because the patient will not be able to complete the necessary follow-up labs. Phototherapy is contraindicated because the patient lives too far from the treatment location.   Dupixent  Counseling: I discussed with the patient the risks of dupilumab  including but not limited to eye infection and irritation, cold sores, injection site reactions, worsening of asthma, allergic reactions and increased risk of parasitic infection. Live vaccines should be avoided while taking dupilumab . Dupilumab  will also interact with certain medications such as warfarin and cyclosporine . The patient understands that monitoring is required and they must alert us  or the primary physician if symptoms of infection or other concerning signs are noted.  Dupixent  Monitoring: There is no laboratory monitoring requirement with Dupixent .     2. Striae (Stretch Marks) - Assessment: Patient presents with  stretch marks on the chest and bilateral arms, likely secondary to topical steroid overuse. This is a cosmetic concern. - Plan:    - Defer treatment until eczema is under control.   - Consider topical retinoid for cosmetic improvement in the future.   No follow-ups on file.  LILLETTE Beth Curtis, CMA, am acting as scribe for [employer], DO.   Documentation: I have reviewed the above documentation for accuracy and completeness, and I agree with the above.  Beth Lenis, DO

## 2023-09-02 NOTE — Patient Instructions (Addendum)
 Hello [place],  Thank you for visiting my office today. Your dedication to managing your eczema and improving your health is greatly appreciated. Below is a summary of our discussion and the established treatment plan:  - Medications Prescribed:   - **Dupixent **: An injectable medication for eczema. A prescription was sent today, with the initial approval process expected to take 2-3 weeks. We Began with sample injections provided today, then continue with one injection every 2 weeks. Remember to keep the medication refrigerated.   - **Tacrolimus **: A non-steroidal cream for itching. Use a nickel-size amount mixed with CeraVe anti-itch cream for application.   - **CeraVe Anti-Itch Cream**: Apply a quarter-size amount mixed with tacrolimus .  - Current Medication Usage:   - Discontinue the intermittent use of oral prednisone .   - Continue with your current shower regimen, using only warm water and your existing shower products.  - Lifestyle Adjustments:   - Engage in oatmeal baths at least once a week, preferably twice, utilizing Aveeno packets.   - After bathing, apply Aquaphor or the prescribed medication to lock in moisture.  - Follow-Up Care:   - No follow-up appointment has been scheduled today. Please call to schedule your injection appointment once Dupixent  is approved.   - Ensure the correct address is provided for medication delivery, especially when at school.   Please confirm your address for medication delivery and verify it with the pharmacy before shipping. We are committed to a smooth transition to your new treatment regimen and are eager to see the positive outcomes. Should you have any questions or require further assistance, please do not hesitate to contact our office.  Best regards,  Dr. [facility]on [facility]   Important Information   Due to recent changes in healthcare laws, you may see results of your pathology and/or laboratory studies on MyChart before the  doctors have had a chance to review them. We understand that in some cases there may be results that are confusing or concerning to you. Please understand that not all results are received at the same time and often the doctors may need to interpret multiple results in order to provide you with the best plan of care or course of treatment. Therefore, we ask that you please give us  2 business days to thoroughly review all your results before contacting the office for clarification. Should we see a critical lab result, you will be contacted sooner.     If You Need Anything After Your Visit   If you have any questions or concerns for your doctor, please call our main line at [phone]. If no one answers, please leave a voicemail as directed and we will return your call as soon as possible. Messages left after 4 pm will be answered the following business day.    You may also send us  a message via MyChart. We typically respond to MyChart messages within 1-2 business days.  For prescription refills, please ask your pharmacy to contact our office. Our fax number is [phone].  If you have an urgent issue when the clinic is closed that cannot wait until the next business day, you can page your doctor at the number below.     Please note that while we do our best to be available for urgent issues outside of office hours, we are not available 24/7.    If you have an urgent issue and are unable to reach us , you may choose to seek medical care at your doctor's office, retail clinic, urgent  care center, or emergency room.   If you have a medical emergency, please immediately call 911 or go to the emergency department. In the event of inclement weather, please call our main line at [phone] for an update on the status of any delays or closures.  Dermatology Medication Tips: Please keep the boxes that topical medications come in in order to help keep track of the instructions about where and how to  use these. Pharmacies typically print the medication instructions only on the boxes and not directly on the medication tubes.   If your medication is too expensive, please contact our office at [phone] or send us  a message through MyChart.    We are unable to tell what your co-pay for medications will be in advance as this is different depending on your insurance coverage. However, we may be able to find a substitute medication at lower cost or fill out paperwork to get insurance to cover a needed medication.    If a prior authorization is required to get your medication covered by your insurance company, please allow us  1-2 business days to complete this process.   Drug prices often vary depending on where the prescription is filled and some pharmacies may offer cheaper prices.   The website www.goodrx.com contains coupons for medications through different pharmacies. The prices here do not account for what the cost may be with help from insurance (it may be cheaper with your insurance), but the website can give you the price if you did not use any insurance.  - You can print the associated coupon and take it with your prescription to the pharmacy.  - You may also stop by our office during regular business hours and pick up a GoodRx coupon card.  - If you need your prescription sent electronically to a different pharmacy, notify our office through [facility] or by phone at [phone]

## 2023-09-13 ENCOUNTER — Ambulatory Visit: Payer: Medicaid Other | Attending: Rheumatology | Admitting: Rheumatology

## 2023-09-13 ENCOUNTER — Encounter: Payer: Self-pay | Admitting: Rheumatology

## 2023-09-13 VITALS — BP 126/85 | HR 94 | Ht 61.0 in | Wt 246.0 lb

## 2023-09-13 DIAGNOSIS — R21 Rash and other nonspecific skin eruption: Secondary | ICD-10-CM | POA: Diagnosis not present

## 2023-09-13 DIAGNOSIS — J45990 Exercise induced bronchospasm: Secondary | ICD-10-CM | POA: Diagnosis not present

## 2023-09-13 DIAGNOSIS — M25572 Pain in left ankle and joints of left foot: Secondary | ICD-10-CM | POA: Diagnosis not present

## 2023-09-13 DIAGNOSIS — R7303 Prediabetes: Secondary | ICD-10-CM | POA: Diagnosis not present

## 2023-09-13 DIAGNOSIS — Z862 Personal history of diseases of the blood and blood-forming organs and certain disorders involving the immune mechanism: Secondary | ICD-10-CM | POA: Diagnosis not present

## 2023-09-13 DIAGNOSIS — Z8619 Personal history of other infectious and parasitic diseases: Secondary | ICD-10-CM

## 2023-09-13 DIAGNOSIS — N921 Excessive and frequent menstruation with irregular cycle: Secondary | ICD-10-CM | POA: Diagnosis not present

## 2023-09-13 DIAGNOSIS — L732 Hidradenitis suppurativa: Secondary | ICD-10-CM

## 2023-09-13 DIAGNOSIS — D75838 Other thrombocytosis: Secondary | ICD-10-CM | POA: Diagnosis not present

## 2023-09-13 DIAGNOSIS — L308 Other specified dermatitis: Secondary | ICD-10-CM

## 2023-09-13 DIAGNOSIS — R700 Elevated erythrocyte sedimentation rate: Secondary | ICD-10-CM

## 2023-09-13 DIAGNOSIS — G8929 Other chronic pain: Secondary | ICD-10-CM

## 2023-09-13 DIAGNOSIS — F32A Depression, unspecified: Secondary | ICD-10-CM

## 2023-09-13 DIAGNOSIS — I7300 Raynaud's syndrome without gangrene: Secondary | ICD-10-CM

## 2023-09-13 DIAGNOSIS — Z975 Presence of (intrauterine) contraceptive device: Secondary | ICD-10-CM

## 2023-09-13 DIAGNOSIS — F419 Anxiety disorder, unspecified: Secondary | ICD-10-CM

## 2023-09-17 LAB — PROTEIN ELECTROPHORESIS, SERUM, WITH REFLEX
Albumin ELP: 3.4 g/dL — ABNORMAL LOW (ref 3.8–4.8)
Alpha 1: 0.3 g/dL (ref 0.2–0.3)
Alpha 2: 0.8 g/dL (ref 0.5–0.9)
Beta 2: 0.4 g/dL (ref 0.2–0.5)
Beta Globulin: 0.5 g/dL (ref 0.4–0.6)
Gamma Globulin: 2.4 g/dL — ABNORMAL HIGH (ref 0.8–1.7)
Total Protein: 7.9 g/dL (ref 6.1–8.1)

## 2023-09-17 LAB — C-REACTIVE PROTEIN: CRP: 10.8 mg/L — ABNORMAL HIGH (ref <8.0)

## 2023-09-17 LAB — SEDIMENTATION RATE: Sed Rate: 80 mm/h — ABNORMAL HIGH (ref 0–20)

## 2023-09-18 ENCOUNTER — Other Ambulatory Visit: Payer: Self-pay | Admitting: Hematology and Oncology

## 2023-09-18 ENCOUNTER — Telehealth: Payer: Self-pay | Admitting: Hematology and Oncology

## 2023-09-18 ENCOUNTER — Telehealth: Payer: Self-pay | Admitting: *Deleted

## 2023-09-18 DIAGNOSIS — D509 Iron deficiency anemia, unspecified: Secondary | ICD-10-CM

## 2023-09-18 DIAGNOSIS — R700 Elevated erythrocyte sedimentation rate: Secondary | ICD-10-CM

## 2023-09-18 NOTE — Telephone Encounter (Signed)
-----   Message from Gearldine Bienenstock sent at 09/17/2023  1:21 PM EST ----- SPEP did not reveal any abnormal protein bands.

## 2023-09-18 NOTE — Telephone Encounter (Signed)
Spoke with patient confirming upcoming appointments  

## 2023-09-20 ENCOUNTER — Encounter: Payer: Self-pay | Admitting: Dermatology

## 2023-09-20 ENCOUNTER — Ambulatory Visit: Payer: Medicaid Other | Admitting: Rheumatology

## 2023-09-27 ENCOUNTER — Inpatient Hospital Stay: Payer: Medicaid Other | Attending: Internal Medicine

## 2023-09-27 ENCOUNTER — Encounter: Payer: Self-pay | Admitting: Hematology and Oncology

## 2023-09-27 ENCOUNTER — Inpatient Hospital Stay: Payer: Medicaid Other | Admitting: Hematology and Oncology

## 2023-09-27 VITALS — BP 130/70 | HR 76 | Temp 98.0°F | Resp 18 | Ht 61.0 in | Wt 241.4 lb

## 2023-09-27 DIAGNOSIS — L732 Hidradenitis suppurativa: Secondary | ICD-10-CM | POA: Diagnosis not present

## 2023-09-27 DIAGNOSIS — D509 Iron deficiency anemia, unspecified: Secondary | ICD-10-CM | POA: Insufficient documentation

## 2023-09-27 LAB — CBC WITH DIFFERENTIAL (CANCER CENTER ONLY)
Abs Immature Granulocytes: 0.01 10*3/uL (ref 0.00–0.07)
Basophils Absolute: 0 10*3/uL (ref 0.0–0.1)
Basophils Relative: 1 %
Eosinophils Absolute: 0 10*3/uL (ref 0.0–0.5)
Eosinophils Relative: 0 %
HCT: 36.2 % (ref 36.0–46.0)
Hemoglobin: 11.4 g/dL — ABNORMAL LOW (ref 12.0–15.0)
Immature Granulocytes: 0 %
Lymphocytes Relative: 34 %
Lymphs Abs: 2 10*3/uL (ref 0.7–4.0)
MCH: 24.1 pg — ABNORMAL LOW (ref 26.0–34.0)
MCHC: 31.5 g/dL (ref 30.0–36.0)
MCV: 76.4 fL — ABNORMAL LOW (ref 80.0–100.0)
Monocytes Absolute: 0.3 10*3/uL (ref 0.1–1.0)
Monocytes Relative: 5 %
Neutro Abs: 3.4 10*3/uL (ref 1.7–7.7)
Neutrophils Relative %: 60 %
Platelet Count: 398 10*3/uL (ref 150–400)
RBC: 4.74 MIL/uL (ref 3.87–5.11)
RDW: 15.3 % (ref 11.5–15.5)
WBC Count: 5.8 10*3/uL (ref 4.0–10.5)
nRBC: 0 % (ref 0.0–0.2)

## 2023-09-27 LAB — FERRITIN: Ferritin: 14 ng/mL (ref 11–307)

## 2023-09-27 LAB — IRON AND IRON BINDING CAPACITY (CC-WL,HP ONLY)
Iron: 42 ug/dL (ref 28–170)
Saturation Ratios: 10 % — ABNORMAL LOW (ref 10.4–31.8)
TIBC: 409 ug/dL (ref 250–450)
UIBC: 367 ug/dL (ref 148–442)

## 2023-09-27 NOTE — Assessment & Plan Note (Signed)
She has not been taking oral iron supplement correctly She is not symptomatic I recommend she take oral iron supplement 30 minutes before her meals She has appointment coming up to see her PCP in a few months I will reach out to her in May to final test results of her blood work If she remained anemic, we can consider intravenous iron infusion in the future

## 2023-09-27 NOTE — Progress Notes (Signed)
[facility] OFFICE PROGRESS NOTE  [place], [place]  ASSESSMENT & PLAN:  Iron deficiency anemia She has not been taking oral iron supplement correctly She is not symptomatic I recommend she take oral iron supplement 30 minutes before her meals She has appointment coming up to see her PCP in a few months I will reach out to her in May to final test results of her blood work If she remained anemic, we can consider intravenous iron infusion in the future  Hydradenitis Her skin infection has improved Dupilumab has helped It is most likely the cause of her persistent elevated sedimentation rate She has no signs of reactive thrombocytosis anymore She will continue treatment as guided by her dermatologist  No orders of the defined types were placed in this encounter.   The total time spent in the appointment was 20 minutes encounter with patients including review of chart and various tests results, discussions about plan of care and coordination of care plan   All questions were answered. The patient knows to call the clinic with any problems, questions or concerns. No barriers to learning was detected.    Artis Delay, MD 1/31/202512:20 PM  INTERVAL HISTORY: Beth Curtis 23 y.o. female returns for further evaluation Her rheumatologist refer her back to me because of persistent elevated sed rate The patient is doing better with recent treatment of dupilumab for hidradenitis suppurative From the iron deficiency standpoint, she is taking oral iron supplement usually around lunchtime She denies signs or symptoms of anemia  SUMMARY OF HEMATOLOGIC HISTORY: Beth Curtis 23 y.o. female is here because of anemia abnormal blood work I have reviewed her scan report from May The patient does not understand fully why she is being referred here On Jan 08, 2023, her blood work revealed hemoglobin of 11.6, MCV of 76.6, platelet count of 460 and low ferritin at 8.6.  The  patient have history of menorrhagia but none recently.  She was prescribed oral iron supplement for some time but she has not been taking it.  She eats normal diet but does have pica with ice craving She has never donated blood or received blood transfusions  More interestingly, serum protein electrophoresis was done for some reason which show elevated serum globulin level with elevated light chains but with normal ratio She has intermittent skin changes on her hand and has seen dermatologist in the past for eczema.  She was prescribed clobetasol cream and she has not been taking it for years despite not having seen the dermatologist for some time Gradually, over the years, she has started to gain weight and has abdominal and skin striae throughout She also developed recurrent skin infection in the skin fold especially in her armpit, under her bra in her groin region She has malar skin rash that has been present for many years She has also noted some discoloration of her skin in her hands especially darkening in the interphalangeal joints but lightening of her skin in between the joints I saw her in 2024 and refer her to see dermatologist and rheumatologist  I have reviewed the past medical history, past surgical history, social history and family history with the patient and they are unchanged from previous note.  ALLERGIES:  has no known allergies.  MEDICATIONS:  Current Outpatient Medications  Medication Sig Dispense Refill   ferrous sulfate 325 (65 FE) MG EC tablet Take 325 mg by mouth daily.     cetirizine (ZYRTEC) 10 MG tablet Take  1 tablet (10 mg total) by mouth daily. 90 tablet 3   Dupilumab (DUPIXENT) 300 MG/2ML SOAJ Inject 300 mg into the skin every 14 (fourteen) days. Starting at day 15 for maintenance. 4 mL 11   hydrOXYzine (ATARAX/VISTARIL) 25 MG tablet TAKE 1 TABLET BY MOUTH AS NEEDED FOR ITCHING 90 tablet 0   ibuprofen (ADVIL) 800 MG tablet Take 800 mg by mouth every 8 (eight)  hours as needed.     tacrolimus (PROTOPIC) 0.1 % ointment Mix with cerave anti itch and apply to affected area twice a day 80 g 5   No current facility-administered medications for this visit.     REVIEW OF SYSTEMS:   Constitutional: Denies fevers, chills or night sweats Eyes: Denies blurriness of vision Ears, nose, mouth, throat, and face: Denies mucositis or sore throat Respiratory: Denies cough, dyspnea or wheezes Cardiovascular: Denies palpitation, chest discomfort or lower extremity swelling Gastrointestinal:  Denies nausea, heartburn or change in bowel habits Skin: Denies abnormal skin rashes Lymphatics: Denies new lymphadenopathy or easy bruising Neurological:Denies numbness, tingling or new weaknesses Behavioral/Psych: Mood is stable, no new changes  All other systems were reviewed with the patient and are negative.  PHYSICAL EXAMINATION: ECOG PERFORMANCE STATUS: 0 - Asymptomatic  Vitals:   09/27/23 1207  BP: 130/70  Pulse: 76  Resp: 18  Temp: 98 F (36.7 C)  SpO2: 99%   Filed Weights   09/27/23 1207  Weight: 241 lb 6.4 oz (109.5 kg)    GENERAL:alert, no distress and comfortable  LABORATORY DATA:  I have reviewed the data as listed     Component Value Date/Time   PROT 7.9 09/13/2023 1153    No results found for: "SPEP", "UPEP"  Lab Results  Component Value Date   WBC 5.8 09/27/2023   NEUTROABS 3.4 09/27/2023   HGB 11.4 (L) 09/27/2023   HCT 36.2 09/27/2023   MCV 76.4 (L) 09/27/2023   PLT 398 09/27/2023      Chemistry   No results found for: "NA", "K", "CL", "CO2", "BUN", "CREATININE", "GLU" No results found for: "CALCIUM", "ALKPHOS", "AST", "ALT", "BILITOT"

## 2023-09-27 NOTE — Assessment & Plan Note (Signed)
Her skin infection has improved Dupilumab has helped It is most likely the cause of her persistent elevated sedimentation rate She has no signs of reactive thrombocytosis anymore She will continue treatment as guided by her dermatologist

## 2023-10-18 DIAGNOSIS — B9689 Other specified bacterial agents as the cause of diseases classified elsewhere: Secondary | ICD-10-CM | POA: Diagnosis not present

## 2023-10-18 DIAGNOSIS — N760 Acute vaginitis: Secondary | ICD-10-CM | POA: Diagnosis not present

## 2023-10-18 DIAGNOSIS — B3731 Acute candidiasis of vulva and vagina: Secondary | ICD-10-CM | POA: Diagnosis not present

## 2023-11-04 DIAGNOSIS — R7303 Prediabetes: Secondary | ICD-10-CM | POA: Diagnosis not present

## 2023-11-04 DIAGNOSIS — L2082 Flexural eczema: Secondary | ICD-10-CM | POA: Diagnosis not present

## 2023-11-04 DIAGNOSIS — J302 Other seasonal allergic rhinitis: Secondary | ICD-10-CM | POA: Diagnosis not present

## 2023-11-04 DIAGNOSIS — R771 Abnormality of globulin: Secondary | ICD-10-CM | POA: Diagnosis not present

## 2023-11-04 DIAGNOSIS — D508 Other iron deficiency anemias: Secondary | ICD-10-CM | POA: Diagnosis not present

## 2023-11-04 DIAGNOSIS — F419 Anxiety disorder, unspecified: Secondary | ICD-10-CM | POA: Diagnosis not present

## 2023-11-20 NOTE — Telephone Encounter (Signed)
error 

## 2023-12-05 ENCOUNTER — Encounter: Payer: Self-pay | Admitting: Dermatology

## 2023-12-05 ENCOUNTER — Ambulatory Visit: Payer: Medicaid Other | Admitting: Dermatology

## 2023-12-05 VITALS — BP 127/81 | HR 81

## 2023-12-05 DIAGNOSIS — L2089 Other atopic dermatitis: Secondary | ICD-10-CM

## 2023-12-05 DIAGNOSIS — L906 Striae atrophicae: Secondary | ICD-10-CM | POA: Diagnosis not present

## 2023-12-05 DIAGNOSIS — Z79899 Other long term (current) drug therapy: Secondary | ICD-10-CM | POA: Diagnosis not present

## 2023-12-05 DIAGNOSIS — D649 Anemia, unspecified: Secondary | ICD-10-CM | POA: Diagnosis not present

## 2023-12-05 DIAGNOSIS — L209 Atopic dermatitis, unspecified: Secondary | ICD-10-CM

## 2023-12-05 MED ORDER — TRIAMCINOLONE ACETONIDE 0.1 % EX CREA: TOPICAL_CREAM | CUTANEOUS | 3 refills | Status: AC

## 2023-12-05 NOTE — Progress Notes (Signed)
   Follow-Up Visit   Subjective  Beth Curtis is a 23 y.o. female who presents for the following: Atopic Dermatitis. 3 month follow up. Started Dupixent 09/02/2023. Injecting herself every 2 weeks. Improves the week she injects, the week after she will flare and itching will worsen. States the breakouts sting and she gets sore areas. Using Tacrolimus as directed. Denies side effects, adverse reactions or injection site reactions with Dupixent.  Shoulders and backs of hands have active rash today.  Rates itching at a 10.   The following portions of the chart were reviewed this encounter and updated as appropriate: medications, allergies, medical history  Review of Systems:  No other skin or systemic complaints except as noted in HPI or Assessment and Plan.  Objective  Well appearing patient in no apparent distress; mood and affect are within normal limits.  Areas Examined: Hands, neck, upper back  Relevant physical exam findings are noted in the Assessment and Plan.  Reviewed labs from 09/27/2023. Rheumatologist ordered labs. Hematologist referred patient to rheumatologist to R/O SLE.              Assessment & Plan   1. Atopic Dermatitis (Eczema) - Assessment: Patient has been on Dupixent for 3 months with partial response. Itching calms and skin starts to clear initially, but itching returns in the second week, currently rated 10/10 on the neck. Previous topical treatments included tacrolimus and steroid creams, with a break due to overuse. Elevated ESR indicates ongoing inflammation, likely due to eczema. Patient has failed Dupixent therapy. BSA 80%, IGA:3 - Plan:    Switch from Dupixent to Cibinqo (oral JAK inhibitor) pending insurance approval    Continue Dupixent in the interim    Prescribe triamcinolone cream for 2 weeks, alternating with tacrolimus    Submit prior authorization for Cibinqo to [employer]    If [employer] not approved, consider Rinvoq as  alternative    Order CBC, CMP, lipids, and TB titer before starting Cibinqo    Provide Cibinqo samples once approved    Follow up in 3-4 weeks regarding insurance coverage    Schedule appointment for 4 months from now   Potential side effects include allergic reaction, herpes infections, injection site reactions and conjunctivitis (inflammation of the eyes).  The use of Dupixent requires long term medication management, including periodic office visits.    2. Anemia - Assessment: Recent blood cell count showed low hemoglobin, indicating mild anemia. - Plan:    Continue over-the-counter iron supplementation    Monitor hemoglobin levels on follow-up CBC  3. Stretch Marks - Assessment: Patient has stretch marks, likely secondary to prolonged steroid cream use for eczema. - Plan:    Consider topical retinoids for stretch mark improvement after eczema is controlled  OTHER ATOPIC DERMATITIS   Related Procedures CBC with Differential/Platelet Comprehensive metabolic panel with GFR Lipid Panel QuantiFERON-TB Gold Plus ENCOUNTER FOR LONG-TERM (CURRENT) USE OF MEDICATIONS   Related Procedures CBC with Differential/Platelet Comprehensive metabolic panel with GFR Lipid Panel QuantiFERON-TB Gold Plus   Return in about 4 months (around 04/05/2024) for Atopic Dermatitis Follow Up.  I, Darcie Easterly, CMA, am acting as scribe for [employer], DO.   Documentation: I have reviewed the above documentation for accuracy and completeness, and I agree with the above.  Louana Roup, DO

## 2023-12-05 NOTE — Patient Instructions (Addendum)
 Hello Beth Curtis,  Thank you for visiting today. Here is a summary of the key instructions:  - Medications:   - Continue Dupixent injections every 2 weeks until further notice   - Use triamcinolone cream for 2 weeks   - Alternate triamcinolone with tacrolimus cream   - Take over-the-counter iron supplements  - Planned Tests before starting new medication called Cibinqo:   - Complete blood count (CBC)   - Comprehensive metabolic panel (CMP)   - Lipid panel   - TB titer test  - Follow-up:   - Schedule a follow-up appointment in 4 months   - Expect a call in 3-4 weeks about insurance coverage for new medication  - New Treatment Plan:   - We will try to get approval for Cibinqo, a daily oral medication   - If approved, we will provide samples to start treatment  Please reach out if you have any questions or concerns.  Warm regards,  Dr. Langston Reusing, Dermatology     Important Information  Due to recent changes in healthcare laws, you may see results of your pathology and/or laboratory studies on MyChart before the doctors have had a chance to review them. We understand that in some cases there may be results that are confusing or concerning to you. Please understand that not all results are received at the same time and often the doctors may need to interpret multiple results in order to provide you with the best plan of care or course of treatment. Therefore, we ask that you please give [place] 2 business days to thoroughly review all your results before contacting the office for clarification. Should we see a critical lab result, you will be contacted sooner.   If You Need Anything After Your Visit  If you have any questions or concerns for your doctor, please call our main line at [phone] If no one answers, please leave a voicemail as directed and we will return your call as soon as possible. Messages left after 4 pm will be answered the following business day.   You may also  send [place] a message via MyChart. We typically respond to MyChart messages within 1-2 business days.  For prescription refills, please ask your pharmacy to contact our office. Our fax number is [phone].  If you have an urgent issue when the clinic is closed that cannot wait until the next business day, you can page your doctor at the number below.    Please note that while we do our best to be available for urgent issues outside of office hours, we are not available 24/7.   If you have an urgent issue and are unable to reach [place], you may choose to seek medical care at your doctor's office, retail clinic, urgent care center, or emergency room.  If you have a medical emergency, please immediately call 911 or go to the emergency department. In the event of inclement weather, please call our main line at [phone] for an update on the status of any delays or closures.  Dermatology Medication Tips: Please keep the boxes that topical medications come in in order to help keep track of the instructions about where and how to use these. Pharmacies typically print the medication instructions only on the boxes and not directly on the medication tubes.   If your medication is too expensive, please contact our office at [phone] or send [place] a message through MyChart.   We are unable to tell what your co-pay for medications  will be in advance as this is different depending on your insurance coverage. However, we may be able to find a substitute medication at lower cost or fill out paperwork to get insurance to cover a needed medication.   If a prior authorization is required to get your medication covered by your insurance company, please allow [place] 1-2 business days to complete this process.  Drug prices often vary depending on where the prescription is filled and some pharmacies may offer cheaper prices.  The website www.goodrx.com contains coupons for medications through different pharmacies. The  prices here do not account for what the cost may be with help from insurance (it may be cheaper with your insurance), but the website can give you the price if you did not use any insurance.  - You can print the associated coupon and take it with your prescription to the pharmacy.  - You may also stop by our office during regular business hours and pick up a GoodRx coupon card.  - If you need your prescription sent electronically to a different pharmacy, notify our office through [facility] or by phone at [phone]

## 2023-12-07 LAB — COMPREHENSIVE METABOLIC PANEL WITH GFR
ALT: 7 IU/L (ref 0–32)
AST: 14 IU/L (ref 0–40)
Albumin: 4 g/dL (ref 4.0–5.0)
Alkaline Phosphatase: 101 IU/L (ref 44–121)
BUN/Creatinine Ratio: 14 (ref 9–23)
BUN: 8 mg/dL (ref 6–20)
Bilirubin Total: 0.2 mg/dL (ref 0.0–1.2)
CO2: 21 mmol/L (ref 20–29)
Calcium: 9.5 mg/dL (ref 8.7–10.2)
Chloride: 100 mmol/L (ref 96–106)
Creatinine, Ser: 0.58 mg/dL (ref 0.57–1.00)
Globulin, Total: 4.4 g/dL (ref 1.5–4.5)
Glucose: 96 mg/dL (ref 70–99)
Potassium: 3.8 mmol/L (ref 3.5–5.2)
Sodium: 137 mmol/L (ref 134–144)
Total Protein: 8.4 g/dL (ref 6.0–8.5)
eGFR: 132 mL/min/{1.73_m2} (ref >59)

## 2023-12-07 LAB — CBC WITH DIFFERENTIAL/PLATELET
Basophils Absolute: 0.1 10*3/uL (ref 0.0–0.2)
Basos: 1 %
EOS (ABSOLUTE): 0 10*3/uL (ref 0.0–0.4)
Eos: 0 %
Hematocrit: 36.6 % (ref 34.0–46.6)
Hemoglobin: 11.8 g/dL (ref 11.1–15.9)
Immature Grans (Abs): 0 10*3/uL (ref 0.0–0.1)
Immature Granulocytes: 0 %
Lymphocytes Absolute: 2.1 10*3/uL (ref 0.7–3.1)
Lymphs: 25 %
MCH: 25.1 pg — ABNORMAL LOW (ref 26.6–33.0)
MCHC: 32.2 g/dL (ref 31.5–35.7)
MCV: 78 fL — ABNORMAL LOW (ref 79–97)
Monocytes Absolute: 0.4 10*3/uL (ref 0.1–0.9)
Monocytes: 4 %
Neutrophils Absolute: 5.7 10*3/uL (ref 1.4–7.0)
Neutrophils: 70 %
Platelets: 377 10*3/uL (ref 150–450)
RBC: 4.71 x10E6/uL (ref 3.77–5.28)
RDW: 14 % (ref 11.7–15.4)
WBC: 8.2 10*3/uL (ref 3.4–10.8)

## 2023-12-07 LAB — LIPID PANEL
Chol/HDL Ratio: 3.7 ratio (ref 0.0–4.4)
Cholesterol, Total: 167 mg/dL (ref 100–199)
HDL: 45 mg/dL (ref >39)
LDL Chol Calc (NIH): 107 mg/dL — ABNORMAL HIGH (ref 0–99)
Triglycerides: 81 mg/dL (ref 0–149)
VLDL Cholesterol Cal: 15 mg/dL (ref 5–40)

## 2023-12-17 ENCOUNTER — Telehealth: Payer: Self-pay

## 2023-12-17 MED ORDER — CIBINQO 100 MG PO TABS: 1.0000 | ORAL_TABLET | Freq: Every day | ORAL | 11 refills | Status: DC

## 2023-12-17 NOTE — Telephone Encounter (Signed)
 Left patient a message to call office for results. Sent Cibinqo  to [facility].

## 2023-12-17 NOTE — Telephone Encounter (Signed)
-----   Message from Louana Roup sent at 12/16/2023  3:00 PM EDT ----- Pt's labs are WNL, please send the rx for Cibinqo  to [facility].  Pt should also have signed forms w/ demographics on deck to be faxed.

## 2024-01-08 DIAGNOSIS — R635 Abnormal weight gain: Secondary | ICD-10-CM | POA: Diagnosis not present

## 2024-01-08 DIAGNOSIS — Z0001 Encounter for general adult medical examination with abnormal findings: Secondary | ICD-10-CM | POA: Diagnosis not present

## 2024-01-08 DIAGNOSIS — Z131 Encounter for screening for diabetes mellitus: Secondary | ICD-10-CM | POA: Diagnosis not present

## 2024-01-08 DIAGNOSIS — R778 Other specified abnormalities of plasma proteins: Secondary | ICD-10-CM | POA: Diagnosis not present

## 2024-01-08 DIAGNOSIS — L2082 Flexural eczema: Secondary | ICD-10-CM | POA: Diagnosis not present

## 2024-01-08 DIAGNOSIS — D508 Other iron deficiency anemias: Secondary | ICD-10-CM | POA: Diagnosis not present

## 2024-01-08 DIAGNOSIS — F419 Anxiety disorder, unspecified: Secondary | ICD-10-CM | POA: Diagnosis not present

## 2024-01-08 DIAGNOSIS — Z136 Encounter for screening for cardiovascular disorders: Secondary | ICD-10-CM | POA: Diagnosis not present

## 2024-01-08 DIAGNOSIS — J302 Other seasonal allergic rhinitis: Secondary | ICD-10-CM | POA: Diagnosis not present

## 2024-01-08 DIAGNOSIS — R7303 Prediabetes: Secondary | ICD-10-CM | POA: Diagnosis not present

## 2024-01-08 DIAGNOSIS — E782 Mixed hyperlipidemia: Secondary | ICD-10-CM | POA: Diagnosis not present

## 2024-01-14 DIAGNOSIS — L2082 Flexural eczema: Secondary | ICD-10-CM | POA: Diagnosis not present

## 2024-01-14 DIAGNOSIS — J302 Other seasonal allergic rhinitis: Secondary | ICD-10-CM | POA: Diagnosis not present

## 2024-01-14 DIAGNOSIS — D508 Other iron deficiency anemias: Secondary | ICD-10-CM | POA: Diagnosis not present

## 2024-01-14 DIAGNOSIS — F419 Anxiety disorder, unspecified: Secondary | ICD-10-CM | POA: Diagnosis not present

## 2024-01-14 DIAGNOSIS — R7303 Prediabetes: Secondary | ICD-10-CM | POA: Diagnosis not present

## 2024-01-14 DIAGNOSIS — R778 Other specified abnormalities of plasma proteins: Secondary | ICD-10-CM | POA: Diagnosis not present

## 2024-01-14 DIAGNOSIS — E782 Mixed hyperlipidemia: Secondary | ICD-10-CM | POA: Diagnosis not present

## 2024-01-21 ENCOUNTER — Telehealth: Payer: Self-pay

## 2024-01-21 ENCOUNTER — Other Ambulatory Visit: Payer: Self-pay | Admitting: Hematology and Oncology

## 2024-01-21 DIAGNOSIS — D509 Iron deficiency anemia, unspecified: Secondary | ICD-10-CM

## 2024-01-21 NOTE — Telephone Encounter (Signed)
 Called per Dr. Marton Sleeper, She had repeat labs in April, slightly anemic  I suggest seeing her in back next month with repeat labs unless she has appt to see her PCP  If she agrees, I will place order/scehduling msg.  Given above message. [place]. Scheduled appt 8/14 per her request. She is aware of appt.

## 2024-01-25 ENCOUNTER — Other Ambulatory Visit: Payer: Self-pay | Admitting: Dermatology

## 2024-02-04 DIAGNOSIS — J302 Other seasonal allergic rhinitis: Secondary | ICD-10-CM | POA: Diagnosis not present

## 2024-02-04 DIAGNOSIS — L2082 Flexural eczema: Secondary | ICD-10-CM | POA: Diagnosis not present

## 2024-02-04 DIAGNOSIS — R7303 Prediabetes: Secondary | ICD-10-CM | POA: Diagnosis not present

## 2024-02-04 DIAGNOSIS — F419 Anxiety disorder, unspecified: Secondary | ICD-10-CM | POA: Diagnosis not present

## 2024-02-04 DIAGNOSIS — E782 Mixed hyperlipidemia: Secondary | ICD-10-CM | POA: Diagnosis not present

## 2024-02-04 DIAGNOSIS — D508 Other iron deficiency anemias: Secondary | ICD-10-CM | POA: Diagnosis not present

## 2024-02-08 DIAGNOSIS — T7840XA Allergy, unspecified, initial encounter: Secondary | ICD-10-CM | POA: Diagnosis not present

## 2024-02-08 DIAGNOSIS — T50905A Adverse effect of unspecified drugs, medicaments and biological substances, initial encounter: Secondary | ICD-10-CM | POA: Diagnosis not present

## 2024-02-10 ENCOUNTER — Encounter: Payer: Self-pay | Admitting: Dermatology

## 2024-02-15 DIAGNOSIS — R590 Localized enlarged lymph nodes: Secondary | ICD-10-CM | POA: Diagnosis not present

## 2024-02-15 DIAGNOSIS — B9689 Other specified bacterial agents as the cause of diseases classified elsewhere: Secondary | ICD-10-CM | POA: Diagnosis not present

## 2024-02-15 DIAGNOSIS — J028 Acute pharyngitis due to other specified organisms: Secondary | ICD-10-CM | POA: Diagnosis not present

## 2024-02-15 DIAGNOSIS — R070 Pain in throat: Secondary | ICD-10-CM | POA: Diagnosis not present

## 2024-02-25 DIAGNOSIS — J302 Other seasonal allergic rhinitis: Secondary | ICD-10-CM | POA: Diagnosis not present

## 2024-02-25 DIAGNOSIS — F419 Anxiety disorder, unspecified: Secondary | ICD-10-CM | POA: Diagnosis not present

## 2024-02-25 DIAGNOSIS — L2082 Flexural eczema: Secondary | ICD-10-CM | POA: Diagnosis not present

## 2024-02-25 DIAGNOSIS — D508 Other iron deficiency anemias: Secondary | ICD-10-CM | POA: Diagnosis not present

## 2024-02-25 DIAGNOSIS — E782 Mixed hyperlipidemia: Secondary | ICD-10-CM | POA: Diagnosis not present

## 2024-02-25 DIAGNOSIS — R7303 Prediabetes: Secondary | ICD-10-CM | POA: Diagnosis not present

## 2024-04-08 ENCOUNTER — Ambulatory Visit: Admitting: Dermatology

## 2024-04-09 ENCOUNTER — Encounter: Payer: Self-pay | Admitting: Hematology and Oncology

## 2024-04-09 ENCOUNTER — Inpatient Hospital Stay: Attending: Hematology and Oncology

## 2024-04-09 ENCOUNTER — Inpatient Hospital Stay (HOSPITAL_BASED_OUTPATIENT_CLINIC_OR_DEPARTMENT_OTHER): Admitting: Hematology and Oncology

## 2024-04-09 VITALS — BP 116/81 | HR 102 | Temp 97.8°F | Resp 18 | Ht 61.0 in | Wt 243.6 lb

## 2024-04-09 DIAGNOSIS — D509 Iron deficiency anemia, unspecified: Secondary | ICD-10-CM | POA: Insufficient documentation

## 2024-04-09 DIAGNOSIS — L2082 Flexural eczema: Secondary | ICD-10-CM | POA: Diagnosis not present

## 2024-04-09 DIAGNOSIS — F419 Anxiety disorder, unspecified: Secondary | ICD-10-CM | POA: Diagnosis not present

## 2024-04-09 DIAGNOSIS — J302 Other seasonal allergic rhinitis: Secondary | ICD-10-CM | POA: Diagnosis not present

## 2024-04-09 DIAGNOSIS — R7303 Prediabetes: Secondary | ICD-10-CM | POA: Diagnosis not present

## 2024-04-09 DIAGNOSIS — D75838 Other thrombocytosis: Secondary | ICD-10-CM | POA: Insufficient documentation

## 2024-04-09 DIAGNOSIS — D508 Other iron deficiency anemias: Secondary | ICD-10-CM | POA: Diagnosis not present

## 2024-04-09 DIAGNOSIS — E782 Mixed hyperlipidemia: Secondary | ICD-10-CM | POA: Diagnosis not present

## 2024-04-09 LAB — CBC WITH DIFFERENTIAL (CANCER CENTER ONLY)
Abs Immature Granulocytes: 0.03 K/uL (ref 0.00–0.07)
Basophils Absolute: 0 K/uL (ref 0.0–0.1)
Basophils Relative: 0 %
Eosinophils Absolute: 0.1 K/uL (ref 0.0–0.5)
Eosinophils Relative: 1 %
HCT: 38.6 % (ref 36.0–46.0)
Hemoglobin: 12.3 g/dL (ref 12.0–15.0)
Immature Granulocytes: 0 %
Lymphocytes Relative: 29 %
Lymphs Abs: 2.8 K/uL (ref 0.7–4.0)
MCH: 25.3 pg — ABNORMAL LOW (ref 26.0–34.0)
MCHC: 31.9 g/dL (ref 30.0–36.0)
MCV: 79.3 fL — ABNORMAL LOW (ref 80.0–100.0)
Monocytes Absolute: 0.5 K/uL (ref 0.1–1.0)
Monocytes Relative: 6 %
Neutro Abs: 6 K/uL (ref 1.7–7.7)
Neutrophils Relative %: 64 %
Platelet Count: 524 K/uL — ABNORMAL HIGH (ref 150–400)
RBC: 4.87 MIL/uL (ref 3.87–5.11)
RDW: 15.7 % — ABNORMAL HIGH (ref 11.5–15.5)
WBC Count: 9.4 K/uL (ref 4.0–10.5)
nRBC: 0 % (ref 0.0–0.2)

## 2024-04-09 LAB — VITAMIN B12: Vitamin B-12: 149 pg/mL — ABNORMAL LOW (ref 180–914)

## 2024-04-09 LAB — FERRITIN: Ferritin: 9 ng/mL — ABNORMAL LOW (ref 11–307)

## 2024-04-09 LAB — IRON AND IRON BINDING CAPACITY (CC-WL,HP ONLY)
Iron: 42 ug/dL (ref 28–170)
Saturation Ratios: 9 % — ABNORMAL LOW (ref 10.4–31.8)
TIBC: 456 ug/dL — ABNORMAL HIGH (ref 250–450)
UIBC: 414 ug/dL (ref 148–442)

## 2024-04-09 NOTE — Assessment & Plan Note (Addendum)
 This is likely due to low iron As above, I recommend oral iron supplement

## 2024-04-09 NOTE — Assessment & Plan Note (Addendum)
 She stopped taking iron a month ago She is no longer anemic but has signs of low MCV and reactive thrombocytosis I recommend the patient to resume taking oral iron supplement daily Since she tolerates oral iron supplement well, she does not need intravenous iron infusion or follow-up appointment with me She has regular appointment with her primary care doctor If she remained anemic despite oral iron supplement or inability to tolerate oral iron supplement, she can call me and we can arrange for intravenous iron infusion in the future

## 2024-04-09 NOTE — Progress Notes (Signed)
   [facility] OFFICE PROGRESS NOTE  Beth Curtis, [place]  ASSESSMENT & PLAN:  Assessment & Plan Iron deficiency anemia, unspecified iron deficiency anemia type She stopped taking iron a month ago She is no longer anemic but has signs of low MCV and reactive thrombocytosis I recommend the patient to resume taking oral iron supplement daily Since she tolerates oral iron supplement well, she does not need intravenous iron infusion or follow-up appointment with me She has regular appointment with her primary care doctor If she remained anemic despite oral iron supplement or inability to tolerate oral iron supplement, she can call me and we can arrange for intravenous iron infusion in the future Reactive thrombocytosis This is likely due to low iron As above, I recommend oral iron supplement    No orders of the defined types were placed in this encounter.   INTERVAL HISTORY: Patient returns for recurrent anemia Symptoms of anemia includes none We reviewed CBC and iron studies  SUMMARY OF HEMATOLOGIC HISTORY: Beth Curtis is seen here because of anemia abnormal blood work I have reviewed her scan report from May The patient does not understand fully why she is being referred here On Jan 08, 2023, her blood work revealed hemoglobin of 11.6, MCV of 76.6, platelet count of 460 and low ferritin at 8.6.  The patient have history of menorrhagia but none recently.  She was prescribed oral iron supplement for some time but she has not been taking it.  She eats normal diet but does have pica with ice craving She has never donated blood or received blood transfusions  More interestingly, serum protein electrophoresis was done for some reason which show elevated serum globulin level with elevated light chains but with normal ratio She has intermittent skin changes on her hand and has seen dermatologist in the past for eczema.  She was prescribed clobetasol  cream and she has not  been taking it for years despite not having seen the dermatologist for some time Gradually, over the years, she has started to gain weight and has abdominal and skin striae throughout She also developed recurrent skin infection in the skin fold especially in her armpit, under her bra in her groin region She has malar skin rash that has been present for many years She has also noted some discoloration of her skin in her hands especially darkening in the interphalangeal joints but lightening of her skin in between the joints I saw her in 2024 and refer her to see dermatologist and rheumatologist  Lab Results  Component Value Date   FERRITIN 14 09/27/2023   Vitals:   04/09/24 1158  BP: 116/81  Pulse: (!) 102  Resp: 18  Temp: 97.8 F (36.6 C)  SpO2: 100%

## 2024-04-10 ENCOUNTER — Ambulatory Visit: Payer: Self-pay | Admitting: Hematology and Oncology

## 2024-04-10 ENCOUNTER — Other Ambulatory Visit: Payer: Self-pay | Admitting: Hematology and Oncology

## 2024-04-10 DIAGNOSIS — E538 Deficiency of other specified B group vitamins: Secondary | ICD-10-CM | POA: Insufficient documentation

## 2024-04-10 NOTE — Progress Notes (Signed)
 Can you schedule see me 15 mins and inj appt on 8/21?

## 2024-04-10 NOTE — Telephone Encounter (Signed)
 Patient provided with the following information regarding recent lab results.  After reviewing options for vitamin B12 supplementation, patient would like to have B12 injections done at [facility].  Dr. Lonn made aware.  Patient verbalized an understanding of the information and knows that a member of our scheduling team will be reaching out to her next week to get her scheduled for future appointments.

## 2024-04-10 NOTE — Telephone Encounter (Signed)
-----   Message from Almarie Bedford sent at 04/10/2024  8:26 AM EDT ----- Her iron studies are low, continue iron as discussed. Surprisingly vitamin B12 came back low. Option: B12 injection here, or with PCP, or trial of high dose OTC 1000 mcg daily and recheck in 3  months. If she wants B12 inj here, let me know ----- Message ----- From: Interface, Lab In [place] Sent: 04/09/2024  11:48 AM EDT To: Almarie Bedford, MD

## 2024-04-15 ENCOUNTER — Ambulatory Visit (INDEPENDENT_AMBULATORY_CARE_PROVIDER_SITE_OTHER): Admitting: Dermatology

## 2024-04-15 VITALS — BP 134/81

## 2024-04-15 DIAGNOSIS — L299 Pruritus, unspecified: Secondary | ICD-10-CM

## 2024-04-15 DIAGNOSIS — L209 Atopic dermatitis, unspecified: Secondary | ICD-10-CM

## 2024-04-15 MED ORDER — PREDNISONE 10 MG PO TABS: ORAL_TABLET | ORAL | 0 refills | Status: AC

## 2024-04-15 MED ORDER — OPZELURA 1.5 % EX CREA: TOPICAL_CREAM | CUTANEOUS | 4 refills | Status: AC

## 2024-04-15 NOTE — Patient Instructions (Addendum)
 Date: Wed Apr 15 2024  Hello Beth Curtis,  Thank you for visiting today. Here is a summary of the key instructions:  - Medications:   - Start new prednisone  taper:     - Take 3 tablets (30 mg) every morning for 5 days     - Then take 2 tablets (20 mg) every morning for 5 days     - Then take 1 tablet (10 mg) every morning for 5 days   - Start using Opzelura  cream:     - Apply twice a day until skin is clear     - Then apply twice a week for prevention     - Use on most stubborn areas (arms and back)     - Apply a pea-sized amount in thin layers     - For back, mix with lotion before applying  - Skin Care:   - After showering:     1. Apply medicine (Opzelura )     2. Apply moisturizer (La Roche-Posay Lipkar Triple Moisture)     3. Apply Aquaphor to lock in moisture   - Use Aquaphor spray for easier application  - Follow-up:   - Return for appointment in 4 months   - Come in for Adbry injection training when approved (expected within 3 weeks)  - Other Instructions:   - Send a message if you have any issues with Opzelura    - Stop using current prednisone  prescription (keep for emergencies)   - Expect to start Adbry injections within the next 3 weeks  Please reach out if you have any questions or concerns.  Warm regards,  Dr. Delon Lenis [facility]at[facility]     Important Information  Due to recent changes in healthcare laws, you may see results of your pathology and/or laboratory studies on MyChart before the doctors have had a chance to review them. We understand that in some cases there may be results that are confusing or concerning to you. Please understand that not all results are received at the same time and often the doctors may need to interpret multiple results in order to provide you with the best plan of care or course of treatment. Therefore, we ask that you please give us  2 business days to thoroughly review all your results before contacting the office for  clarification. Should we see a critical lab result, you will be contacted sooner.   If You Need Anything After Your Visit  If you have any questions or concerns for your doctor, please call our main line at [phone] If no one answers, please leave a voicemail as directed and we will return your call as soon as possible. Messages left after 4 pm will be answered the following business day.   You may also send us  a message via MyChart. We typically respond to MyChart messages within 1-2 business days.  For prescription refills, please ask your pharmacy to contact our office. Our fax number is [phone].  If you have an urgent issue when the clinic is closed that cannot wait until the next business day, you can page your doctor at the number below.    Please note that while we do our best to be available for urgent issues outside of office hours, we are not available 24/7.   If you have an urgent issue and are unable to reach us , you may choose to seek medical care at your doctor's office, retail clinic, urgent care center, or emergency room.  If you have a  medical emergency, please immediately call 911 or go to the emergency department. In the event of inclement weather, please call our main line at [phone] for an update on the status of any delays or closures.  Dermatology Medication Tips: Please keep the boxes that topical medications come in in order to help keep track of the instructions about where and how to use these. Pharmacies typically print the medication instructions only on the boxes and not directly on the medication tubes.   If your medication is too expensive, please contact our office at [phone] or send us  a message through MyChart.   We are unable to tell what your co-pay for medications will be in advance as this is different depending on your insurance coverage. However, we may be able to find a substitute medication at lower cost or fill out paperwork to  get insurance to cover a needed medication.   If a prior authorization is required to get your medication covered by your insurance company, please allow us  1-2 business days to complete this process.  Drug prices often vary depending on where the prescription is filled and some pharmacies may offer cheaper prices.  The website www.goodrx.com contains coupons for medications through different pharmacies. The prices here do not account for what the cost may be with help from insurance (it may be cheaper with your insurance), but the website can give you the price if you did not use any insurance.  - You can print the associated coupon and take it with your prescription to the pharmacy.  - You may also stop by our office during regular business hours and pick up a GoodRx coupon card.  - If you need your prescription sent electronically to a different pharmacy, notify our office through [facility] or by phone at [phone]

## 2024-04-15 NOTE — Progress Notes (Signed)
 Follow-Up Visit   Subjective  Beth Curtis is a 23 y.o. female established patient who presents for FOLLOW UP on the diagnoses listed below:  Patient was last evaluated on 12/05/23.   Atopic Derm: Prescribed alternating TMC & tacrolimus  every 2 weeks. Continue Dupixent  until approved for oral Cibinqo . However, pt sent MyChart stating she D/C Cibinqo  due to side effects of mouth sores, swollen tonsils and rashes and was advised a discussion would be had in regard to Tx options at next f/u OV. Pt stated she recently received a Rx for prednisone  from PCP that she started last Thursday.   Are you nursing, pregnant or trying to conceive? No   The following portions of the chart were reviewed this encounter and updated as appropriate: medications, allergies, medical history  Review of Systems:  No other skin or systemic complaints except as noted in HPI or Assessment and Plan.  Objective  Well appearing patient in no apparent distress; mood and affect are within normal limits.   A focused examination was performed of the following areas: scattered   Relevant exam findings are noted in the Assessment and Plan.                        Assessment & Plan   1. Atopic Dermatitis (Eczema) - Assessment: Patient has a history of severe atopic dermatitis, previously treated with Dupixent  and Cibinqo . Cibinqo  was discontinued after 3 weeks due to adverse reactions including oral sores, swollen tonsils, and skin reactivity. Recent exacerbation required urgent care visit and prednisone  treatment. Current symptoms include severe itching and sores from scratching. Previous topical treatments included tacrolimus  ointment and Aquaphor, which were insufficient for control. Patient's skin condition has improved since last visit but remains inadequately controlled.  - Plan:    Initiate Adbry (tralokinumab) injection    Submit prior authorization request    Schedule injection training upon  approval    Prescribe Opzelura  (ruxolitinib) cream for topical use    Apply twice daily to most affected areas (arms and back) until clear, then twice weekly for maintenance    Submit prior authorization request to Medicaid    New prednisone  taper: 30 mg PO daily for 5 days, 20 mg PO daily for 5 days, 10 mg PO daily for 5 days    Apply Opzelura  to affected areas    Use La Roche-Posay Lipkar Triple Moisture as moisturizer    Apply Aquaphor spray as occlusive agent    Discontinue current prednisone  regimen (20 mg daily)    Patient instructed to send message if issues arise with Opzelura   Follow-up in 4 months to assess efficacy of new treatment regimen.  Pt is not a candidate for methotrexate or cyclosporine due to inability for follow up labs and contraindications with other medications. Pt has no access to a light box for phototherapy.  Due to the progressive and chronic nature of her psoriasis, patient has tried and failed numerous topicals creams as noted above, the next best therapeutic option is a systemic therapy. It is medically necessary to help improve her quality of life.   Reviewed risks of biologics including immunosuppression, infections (i.e. TB reactivation), injection site reaction, and failure to improve condition. Goal is control of skin condition, not cure.  Some older biologics such as Humira and Enbrel may slightly increase risk of malignancy and may worsen congestive heart failure.  Taltz, Cosentyx, and Bimzelx may cause inflammatory bowel disease to flare or may increase incidence of  yeast infections. Skyrizi, Tremfya, and Stelara may also slightly increase risk of infection. The use of biologics requires long term medication management, including periodic office visits, annual TB screening test and monitoring of blood work.    No follow-ups on file.   Documentation: I have reviewed the above documentation for accuracy and completeness, and I agree with the above.  I,  Shirron Maranda, CMA, am acting as scribe for [employer], DO.   Delon Lenis, DO

## 2024-04-16 ENCOUNTER — Inpatient Hospital Stay: Admitting: Hematology and Oncology

## 2024-04-16 ENCOUNTER — Encounter: Payer: Self-pay | Admitting: Hematology and Oncology

## 2024-04-16 ENCOUNTER — Ambulatory Visit

## 2024-04-20 DIAGNOSIS — E538 Deficiency of other specified B group vitamins: Secondary | ICD-10-CM | POA: Diagnosis not present

## 2024-04-20 DIAGNOSIS — D75839 Thrombocytosis, unspecified: Secondary | ICD-10-CM | POA: Diagnosis not present

## 2024-04-20 DIAGNOSIS — R778 Other specified abnormalities of plasma proteins: Secondary | ICD-10-CM | POA: Diagnosis not present

## 2024-04-20 DIAGNOSIS — D509 Iron deficiency anemia, unspecified: Secondary | ICD-10-CM | POA: Diagnosis not present

## 2024-05-04 ENCOUNTER — Other Ambulatory Visit: Payer: Self-pay

## 2024-05-04 DIAGNOSIS — L209 Atopic dermatitis, unspecified: Secondary | ICD-10-CM

## 2024-05-14 ENCOUNTER — Encounter: Payer: Self-pay | Admitting: Dermatology

## 2024-05-14 ENCOUNTER — Ambulatory Visit: Admitting: Dermatology

## 2024-05-14 DIAGNOSIS — L209 Atopic dermatitis, unspecified: Secondary | ICD-10-CM

## 2024-05-14 DIAGNOSIS — L299 Pruritus, unspecified: Secondary | ICD-10-CM

## 2024-05-14 MED ORDER — TRALOKINUMAB-LDRM 150 MG/ML ~~LOC~~ SOSY
600.0000 mg | PREFILLED_SYRINGE | Freq: Once | SUBCUTANEOUS | Status: AC
  Administered 2024-05-14: 600 mg via SUBCUTANEOUS

## 2024-05-14 NOTE — Patient Instructions (Addendum)
 Date: Thu May 14 2024  Hello Beth Curtis,  Thank you for visiting today. Here is a summary of the key instructions:  - Medications:   - Start Adbry  injections:     - Loading dose of 600 mg today     - Continue with 300 mg every 2 weeks   - Continue alternating:     - 2 weeks of triamcinolone      - 2 weeks of tacrolimus    - Keep using Lip Car Triple Moisture for moisturizing  - Skin Care:   - When weather gets colder and drier:     - Spray Aquaphor on top of moisturizer to lock in moisture  - Follow-up:   - Return for follow-up appointment in 4 months   - Call if you have any questions or concerns  Please reach out if you have any questions or concerns.  Warm regards,  Dr. Delon Lenis [facility] Important Information  Due to recent changes in healthcare laws, you may see results of your pathology and/or laboratory studies on MyChart before the doctors have had a chance to review them. We understand that in some cases there may be results that are confusing or concerning to you. Please understand that not all results are received at the same time and often the doctors may need to interpret multiple results in order to provide you with the best plan of care or course of treatment. Therefore, we ask that you please give us  2 business days to thoroughly review all your results before contacting the office for clarification. Should we see a critical lab result, you will be contacted sooner.   If You Need Anything After Your Visit  If you have any questions or concerns for your doctor, please call our main line at [phone] If no one answers, please leave a voicemail as directed and we will return your call as soon as possible. Messages left after 4 pm will be answered the following business day.   You may also send us  a message via MyChart. We typically respond to MyChart messages within 1-2 business days.  For prescription refills, please ask your pharmacy to contact our office.  Our fax number is [phone].  If you have an urgent issue when the clinic is closed that cannot wait until the next business day, you can page your doctor at the number below.    Please note that while we do our best to be available for urgent issues outside of office hours, we are not available 24/7.   If you have an urgent issue and are unable to reach us , you may choose to seek medical care at your doctor's office, retail clinic, urgent care center, or emergency room.  If you have a medical emergency, please immediately call 911 or go to the emergency department. In the event of inclement weather, please call our main line at [phone] for an update on the status of any delays or closures.  Dermatology Medication Tips: Please keep the boxes that topical medications come in in order to help keep track of the instructions about where and how to use these. Pharmacies typically print the medication instructions only on the boxes and not directly on the medication tubes.   If your medication is too expensive, please contact our office at [phone] or send us  a message through MyChart.   We are unable to tell what your co-pay for medications will be in advance as this is different depending on your insurance coverage. However,  we may be able to find a substitute medication at lower cost or fill out paperwork to get insurance to cover a needed medication.   If a prior authorization is required to get your medication covered by your insurance company, please allow us  1-2 business days to complete this process.  Drug prices often vary depending on where the prescription is filled and some pharmacies may offer cheaper prices.  The website www.goodrx.com contains coupons for medications through different pharmacies. The prices here do not account for what the cost may be with help from insurance (it may be cheaper with your insurance), but the website can give you the price if you did not use  any insurance.  - You can print the associated coupon and take it with your prescription to the pharmacy.  - You may also stop by our office during regular business hours and pick up a GoodRx coupon card.  - If you need your prescription sent electronically to a different pharmacy, notify our office through [facility] or by phone at [phone]

## 2024-05-14 NOTE — Progress Notes (Signed)
   Follow-Up Visit   Subjective  Beth Curtis is a 23 y.o. female established patient who presents for FOLLOW UP on the diagnoses listed below:   Patient was last evaluated on 04/15/24.  Patient presents today for Adbry  injection teaching.    The following portions of the chart were reviewed this encounter and updated as appropriate: medications, allergies, medical history  Review of Systems:  No other skin or systemic complaints except as noted in HPI or Assessment and Plan.  Objective  Well appearing patient in no apparent distress; mood and affect are within normal limits.   A focused examination was performed of the following areas: B/L thighs   Relevant exam findings are noted in the Assessment and Plan.          Assessment & Plan   ADBRY  INJECTION TEACHING FOR TX OF ATOPIC DERM - Patient instructed how to complete Adbry  Injections with Demonstration Pen - Patient completed Injections while in office at B/L Anterior Thighs, Mild erythema noted at injection site.   - Advised to continue topicals for break through Eczema Flares - Patient tolerated well and demonstrated understanding - Recommend gentle skin care. - Plan to follow up in 4 months  - Assessment: Patient has a history of eczema with previous failed treatment on Dupixent . Currently alternating triamcinolone  with tacrolimus , which has provided some improvement. IgE score is now at 2, and itch is reported as 4 out of 10. Given the persistent symptoms and suboptimal control, a decision was made to initiate Adbry  injections for better management of itch and eczema.  - Plan:    Initiated Adbry  injections with loading dose of 600 mg administered today    Continue with 300 mg every 2 weeks    Continue alternating 2 weeks triamcinolone  with 2 weeks tacrolimus     Continue current moisturizing regimen with lip car triple moisture    When weather becomes colder and drier, spray Aquaphor on top as an occlusive agent     Expect clear or almost clear skin and itch-free status at next visit  Follow-up in 4 months to assess response to Adbry  treatment.   ATOPIC DERMATITIS, UNSPECIFIED TYPE   Related Medications Ruxolitinib Phosphate  (OPZELURA ) 1.5 % CREA apply to affected areas BID until clear. Once clear apply 2 times a week tralokinumab  SOSY 600 mg   Return in about 4 months (around 09/13/2024) for ATOPIC DERM/ECZEMA.   Documentation: I have reviewed the above documentation for accuracy and completeness, and I agree with the above.  I, Shirron Maranda, CMA, am acting as scribe for [employer], DO.   Delon Lenis, DO

## 2024-06-03 ENCOUNTER — Ambulatory Visit: Admitting: Dermatology

## 2024-07-08 ENCOUNTER — Other Ambulatory Visit: Payer: Self-pay

## 2024-07-08 DIAGNOSIS — H16209 Unspecified keratoconjunctivitis, unspecified eye: Secondary | ICD-10-CM

## 2024-07-08 MED ORDER — CYCLOSPORINE 0.05 % OP EMUL: 1.0000 [drp] | Freq: Two times a day (BID) | OPHTHALMIC | 2 refills | Status: AC

## 2024-07-08 NOTE — Telephone Encounter (Signed)
 Correspondence received from [facility] stating that pt is having a reaction to Adbry  causing watery - itchy irritation around the eyes that presented the day of her injection and resolves by the next dose. Her last injection was on 06/28/24. Pt has not seen an eye doctor as she does not have one. She has been using OTC Clear Eye brand drops to help.   Restasis Rx & referral to ophthalmology pended for review below.

## 2024-07-08 NOTE — Telephone Encounter (Signed)
 Pt has been informed that urgent referral to ophthalmology was placed and Rx for Restasis sent to her local pharmacy.   Pt has expressed understanding and will call the office for any additional concerns.

## 2024-08-12 ENCOUNTER — Ambulatory Visit: Admitting: Dermatology

## 2024-09-15 ENCOUNTER — Ambulatory Visit: Admitting: Dermatology

## 2024-09-15 ENCOUNTER — Encounter: Payer: Self-pay | Admitting: Dermatology

## 2024-09-15 VITALS — BP 128/81

## 2024-09-15 DIAGNOSIS — L299 Pruritus, unspecified: Secondary | ICD-10-CM

## 2024-09-15 DIAGNOSIS — L209 Atopic dermatitis, unspecified: Secondary | ICD-10-CM

## 2024-09-15 MED ORDER — NEMLUVIO 30 MG ~~LOC~~ AUIJ: AUTO-INJECTOR | SUBCUTANEOUS | 12 refills | Status: AC

## 2024-09-15 MED ORDER — NEMLUVIO 30 MG ~~LOC~~ AUIJ: AUTO-INJECTOR | SUBCUTANEOUS | 0 refills | Status: AC

## 2024-09-15 MED ORDER — CLOBETASOL PROPIONATE 0.05 % EX CREA: 1.0000 | TOPICAL_CREAM | Freq: Two times a day (BID) | CUTANEOUS | 5 refills | Status: AC

## 2024-09-15 NOTE — Progress Notes (Signed)
" ° °  Follow-Up Visit   Subjective  Beth Curtis is a 24 y.o. female established patient who presents for FOLLOW UP on the diagnoses listed below:  Patient was last evaluated on 05/14/24 for Adbry  inj teaching.    Atopic Derm: Pt stated for the first month the Abdry worked well w/o flares. However, she mentioned after the first month she has remained getting flares daily. She stated that she has tried to restart her tacrolimus  but pt stated that it irritated her skin with a burning sensation. She does not have any more TMC or Opzelura  on hand. She stated she is constantly itchy - rating 8/10.   Tried & Failed: Dupixent  inj 2024 - 2025 Cibinqo  11/2023 - 01/2024 Triamcinolone  0.1  04/2011 - 11/2023 Prednisone  taper 03/2024 Adbry  injections 04/2024 - 08/2024 Opzelura  1.5% cream - 03/2024 - 08/2024 Tacrolimus  0.1% oint 2016 - current Clobetasol  0.05% cream - 2013 - current  Are you nursing, pregnant or trying to conceive? no   The following portions of the chart were reviewed this encounter and updated as appropriate: medications, allergies, medical history  Review of Systems:  No other skin or systemic complaints except as noted in HPI or Assessment and Plan.  Objective  Well appearing patient in no apparent distress; mood and affect are within normal limits.   A focused examination was performed of the following areas: scattered   Relevant exam findings are noted in the Assessment and Plan.                       Assessment & Plan   SEVERE ATOPIC DERMATITIS and PRURITUS Exam: Scaly pink papules coalescing to plaques 80% BSA IGA 1.5, Itch 9/10  Pt presented with Chronic atopic dermatitis with significant pruritus, particularly on the back, hands, and thighs. Previous treatments including Dupixent , Cibinqo , and Adbry  were ineffective or caused adverse reactions. Current treatment with Adbry  provides approximately 50% improvement, down from 80% initially. Pruritus is rated 8/10 on  the back and 10/10 on the hands. Skin is more itchy than thick or flaky, indicating a different inflammatory profile, possibly related to her ethnicity. Nemluvio  is considered due to its different mechanism of action targeting itch receptors without immune suppression, similar to Advair and Dupixent .   - Prescribed clobetasol  twice daily for two weeks, then alternate with tacrolimus  for two weeks until pruritus resolves.  - Continue Adbry  injections every two weeks until Nemluvio  is approved. - Applied for Nemluvio  authorization, noting it targets itch receptors without immune suppression.  - Instructed on injection training for Nemluvio  once approved.  - Continue using Dove for washing and aggressive moisturization.  Plan: Nemluvio  Initiation Indications:  Patient isn't a candidate for systemic therapy with methotrexate or cyclosporine . Patient has been unresponsive to aggressive topical therapy and oral prednisolone is not appropriate long term treatment.  Failed Treatments: Topical Steroids and Topical Protopic   Specific Contraindications Cyclosporine  is contraindicated because the patient will not be able to complete the necessary follow-up labs. Methotrexate is contraindicated because the patient will not be able to complete the necessary follow-up labs. Phototherapy is contraindicated because the patient lives too far from the treatment location.   Monitoring: There is no laboratory monitoring requirement with Nemluvio .    No follow-ups on file.   Documentation: I have reviewed the above documentation for accuracy and completeness, and I agree with the above.  I, Shirron Maranda, CMA II, am acting as scribe for:  Delon Lenis, DO "

## 2024-09-15 NOTE — Patient Instructions (Addendum)
 " VISIT SUMMARY:  During your visit, we discussed your ongoing issues with eczema, particularly the significant itchiness and skin flare-ups on your back, hands, and thighs. We reviewed your previous treatments and their effectiveness, and we have made some adjustments to your treatment plan to help manage your symptoms better.  YOUR PLAN:  -ATOPIC DERMATITIS:  Atopic dermatitis, also known as eczema, is a condition that makes your skin red and itchy.   We have prescribed clobetasol  to be applied twice daily for two weeks, then alternate with tacrolimus  for two weeks until the itching resolves.   You should continue with Adbry  injections every two weeks until Nemluvio  is approved.   Nemluvio  targets itch receptors without suppressing the immune system. We have applied for authorization for Nemluvio  and will provide injection training once it is approved. Continue using Dove for washing and moisturize aggressively.  INSTRUCTIONS:  Please follow the new treatment plan as discussed. We will notify you once Nemluvio  is approved and provide training for its use. Continue with your current regimen and let us  know if you experience any new or worsening symptoms.     Important Information  Due to recent changes in healthcare laws, you may see results of your pathology and/or laboratory studies on MyChart before the doctors have had a chance to review them. We understand that in some cases there may be results that are confusing or concerning to you. Please understand that not all results are received at the same time and often the doctors may need to interpret multiple results in order to provide you with the best plan of care or course of treatment. Therefore, we ask that you please give us  2 business days to thoroughly review all your results before contacting the office for clarification. Should we see a critical lab result, you will be contacted sooner.   If You Need Anything After Your  Visit  If you have any questions or concerns for your doctor, please call our main line at [phone] If no one answers, please leave a voicemail as directed and we will return your call as soon as possible. Messages left after 4 pm will be answered the following business day.   You may also send us  a message via MyChart. We typically respond to MyChart messages within 1-2 business days.  For prescription refills, please ask your pharmacy to contact our office. Our fax number is [phone].  If you have an urgent issue when the clinic is closed that cannot wait until the next business day, you can page your doctor at the number below.    Please note that while we do our best to be available for urgent issues outside of office hours, we are not available 24/7.   If you have an urgent issue and are unable to reach us , you may choose to seek medical care at your doctor's office, retail clinic, urgent care center, or emergency room.  If you have a medical emergency, please immediately call 911 or go to the emergency department. In the event of inclement weather, please call our main line at [phone] for an update on the status of any delays or closures.  Dermatology Medication Tips: Please keep the boxes that topical medications come in in order to help keep track of the instructions about where and how to use these. Pharmacies typically print the medication instructions only on the boxes and not directly on the medication tubes.   If your medication is too expensive, please contact our  office at [phone] or send us  a message through MyChart.   We are unable to tell what your co-pay for medications will be in advance as this is different depending on your insurance coverage. However, we may be able to find a substitute medication at lower cost or fill out paperwork to get insurance to cover a needed medication.   If a prior authorization is required to get your medication covered by  your insurance company, please allow us  1-2 business days to complete this process.  Drug prices often vary depending on where the prescription is filled and some pharmacies may offer cheaper prices.  The website www.goodrx.com contains coupons for medications through different pharmacies. The prices here do not account for what the cost may be with help from insurance (it may be cheaper with your insurance), but the website can give you the price if you did not use any insurance.  - You can print the associated coupon and take it with your prescription to the pharmacy.  - You may also stop by our office during regular business hours and pick up a GoodRx coupon card.  - If you need your prescription sent electronically to a different pharmacy, notify our office through [facility] or by phone at [phone]     "

## 2024-09-30 ENCOUNTER — Encounter: Payer: Self-pay | Admitting: Dermatology

## 2024-09-30 ENCOUNTER — Ambulatory Visit: Admitting: Dermatology

## 2024-09-30 VITALS — BP 121/81

## 2024-09-30 DIAGNOSIS — L089 Local infection of the skin and subcutaneous tissue, unspecified: Secondary | ICD-10-CM

## 2024-09-30 DIAGNOSIS — L309 Dermatitis, unspecified: Secondary | ICD-10-CM

## 2024-09-30 MED ORDER — DOXYCYCLINE HYCLATE 100 MG PO TABS: 100.0000 mg | ORAL_TABLET | Freq: Two times a day (BID) | ORAL | 0 refills | Status: AC

## 2024-09-30 MED ORDER — MUPIROCIN 2 % EX OINT: 1.0000 | TOPICAL_OINTMENT | Freq: Two times a day (BID) | CUTANEOUS | 5 refills | Status: AC

## 2024-09-30 NOTE — Progress Notes (Signed)
" ° °  Follow-Up Visit   Subjective  Beth Curtis is a 24 y.o. female established patient who presents for FOLLOW UP on the diagnoses listed below:  Patient was last evaluated on 09/15/24 for atopic derm but here today for new issue.  Abcess: Pt has an abscess in the R side of the groin area that she stated has been consistently there for 3 years. She was Rx abx when it was drained in August 2023 but the opening never fully healed - pt stated that it does not get inflamed or painful but constantly leak contents. She mentioned she previously did have another abscess on the buttocks 2 years ago but has since drained and resolved.    The following portions of the chart were reviewed this encounter and updated as appropriate: medications, allergies, medical history  Review of Systems:  No other skin or systemic complaints except as noted in HPI or Assessment and Plan.  Objective  Well appearing patient in no apparent distress; mood and affect are within normal limits.   A focused examination was performed of the following areas: R side of groin   Relevant exam findings are noted in the Assessment and Plan.   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 No associated pain or increase in size. Differential diagnosis includes hidradenitis suppurativa, but treatment remains unchanged. Cultures taken to identify bacterial presence and guide antibiotic therapy.  - Recommended benzoyl peroxide 4% body wash to reduce bacterial count, instructed to apply before showering and allow to sit for 30 seconds to 1 minute. - Prescribed Bupiercin topical antibiotic ointment to apply post-shower to promote healing and reduce bacterial load. - Prescribed doxycycline  to be taken twice daily for seven days, advised to take with a large meal to prevent stomach upset. - Scheduled follow-up in two months to assess  response to treatment.  Eczema Management ongoing with approval for Nemluvio  injections. Awaiting scheduling for injection training and follow-up appointment to assess treatment efficacy. - Scheduled Nemluvio  injection training for Friday at 12 PM. - Scheduled follow-up appointment in two months to evaluate eczema management and Namuvio efficacy.   No follow-ups on file.   Documentation: I have reviewed the above documentation for accuracy and completeness, and I agree with the above.  I, Shirron Maranda, CMA II, am acting as scribe for:  Delon Lenis, DO "

## 2024-09-30 NOTE — Patient Instructions (Addendum)
 " VISIT SUMMARY:  During your visit, we discussed the chronic draining cyst on your skin and ongoing eczema management. We have developed a treatment plan to address both issues and scheduled follow-up appointments to monitor your progress.  YOUR PLAN:  -CHRONIC DRAINING SUBCUTANEOUS CYST:  A chronic draining subcutaneous cyst is a fluid-filled sac under the skin that has been draining for an extended period.   To manage this, we have taken cultures to identify any bacterial presence and guide antibiotic therapy.   You should use CeraVe benzoyl peroxide 4% body wash before showering, allowing it to sit for 30 seconds to 1 minute.  After showering, apply mupirocin  topical antibiotic ointment to the cyst.   Additionally, take doxycycline  twice daily for seven days with a large meal to prevent stomach upset. We will follow up in two months to assess your response to the treatment.  -ECZEMA:  Eczema is a condition that makes your skin red, inflamed, and itchy. We have approved Namuvio injections for your eczema management. You are scheduled for injection training on Friday at 12 PM. We will follow up in two months to evaluate the effectiveness of the Namuvio treatment.  INSTRUCTIONS:  Please follow the treatment plan for your chronic draining cyst and eczema as discussed. Attend the Namuvio injection training on Friday at 12 PM. Take doxycycline  twice daily for seven days with a large meal. Use benzoyl peroxide 4% body wash before showering and apply Bupiercin ointment after showering. We will see you in two months to assess your progress.    Important Information  Due to recent changes in healthcare laws, you may see results of your pathology and/or laboratory studies on MyChart before the doctors have had a chance to review them. We understand that in some cases there may be results that are confusing or concerning to you. Please understand that not all results are received at the same time  and often the doctors may need to interpret multiple results in order to provide you with the best plan of care or course of treatment. Therefore, we ask that you please give us  2 business days to thoroughly review all your results before contacting the office for clarification. Should we see a critical lab result, you will be contacted sooner.   If You Need Anything After Your Visit  If you have any questions or concerns for your doctor, please call our main line at [phone] If no one answers, please leave a voicemail as directed and we will return your call as soon as possible. Messages left after 4 pm will be answered the following business day.   You may also send us  a message via MyChart. We typically respond to MyChart messages within 1-2 business days.  For prescription refills, please ask your pharmacy to contact our office. Our fax number is [phone].  If you have an urgent issue when the clinic is closed that cannot wait until the next business day, you can page your doctor at the number below.    Please note that while we do our best to be available for urgent issues outside of office hours, we are not available 24/7.   If you have an urgent issue and are unable to reach us , you may choose to seek medical care at your doctor's office, retail clinic, urgent care center, or emergency room.  If you have a medical emergency, please immediately call 911 or go to the emergency department. In the event of inclement weather, please call our  main line at [phone] for an update on the status of any delays or closures.  Dermatology Medication Tips: Please keep the boxes that topical medications come in in order to help keep track of the instructions about where and how to use these. Pharmacies typically print the medication instructions only on the boxes and not directly on the medication tubes.   If your medication is too expensive, please contact our office at [phone] or  send us  a message through MyChart.   We are unable to tell what your co-pay for medications will be in advance as this is different depending on your insurance coverage. However, we may be able to find a substitute medication at lower cost or fill out paperwork to get insurance to cover a needed medication.   If a prior authorization is required to get your medication covered by your insurance company, please allow us  1-2 business days to complete this process.  Drug prices often vary depending on where the prescription is filled and some pharmacies may offer cheaper prices.  The website www.goodrx.com contains coupons for medications through different pharmacies. The prices here do not account for what the cost may be with help from insurance (it may be cheaper with your insurance), but the website can give you the price if you did not use any insurance.  - You can print the associated coupon and take it with your prescription to the pharmacy.  - You may also stop by our office during regular business hours and pick up a GoodRx coupon card.  - If you need your prescription sent electronically to a different pharmacy, notify our office through [facility] or by phone at [phone]     "

## 2024-10-02 ENCOUNTER — Ambulatory Visit: Admitting: Dermatology

## 2024-10-02 ENCOUNTER — Encounter: Payer: Self-pay | Admitting: Dermatology

## 2024-10-02 DIAGNOSIS — L309 Dermatitis, unspecified: Secondary | ICD-10-CM

## 2024-10-02 MED ORDER — NEMOLIZUMAB-ILTO 30 MG ~~LOC~~ AUIJ: 30.0000 mg | AUTO-INJECTOR | Freq: Once | SUBCUTANEOUS | Status: DC

## 2024-10-02 MED ORDER — NEMOLIZUMAB-ILTO 30 MG ~~LOC~~ AUIJ
60.0000 mg | AUTO-INJECTOR | Freq: Once | SUBCUTANEOUS | Status: AC
  Administered 2024-10-02: 60 mg via SUBCUTANEOUS

## 2024-10-02 NOTE — Patient Instructions (Addendum)

## 2024-10-02 NOTE — Progress Notes (Unsigned)
 "  Follow-Up Visit   Subjective  Beth Curtis is a 24 y.o. female who presents for the following: Nemluvio  Injection Teaching for Eczema  Patient present today for follow up visit for Nemluvio  Injection Teaching. Patient was last evaluated on 09/15/24 for Eczema. Patient reports sxs are unchanged. Patient denies medication changes. Today patient rates her itch 9 out 10. Patient states the severity of her condition has negatively impacted her quality of life.   Patient has previoulsy Tried & Failed the following medications: - Dupixent  inj: 2024 - 2025 - Cibinqo : 11/2023 - 01/2024 - Triamcinolone  0.1 cream: 04/2011 - 11/2023 - Prednisone  taper: 03/2024 - Adbry  injections: 04/2024 - 08/2024 - Opzelura  1.5% cream: 03/2024 - 08/2024 - Tacrolimus  0.1% oint: 2016 - current - Clobetasol  0.05% cream & Ointment: 2013 - current  Patient reports she is not actively pregnant, nursing or trying to conceive.  Patient provided verbal consent for the use of an AI-assisted program to generate a detailed after-visit summary. The patient understands that the AI tool is used to support clinical documentation and that all information will be reviewed and verified by the healthcare provider.  The following portions of the chart were reviewed this encounter and updated as appropriate: medications, allergies, medical history  Review of Systems:  No other skin or systemic complaints except as noted in HPI or Assessment and Plan.  Objective  Well appearing patient in no apparent distress; mood and affect are within normal limits.  A full examination was performed including scalp, head, eyes, ears, nose, lips, neck, chest, axillae, abdomen, back, buttocks, bilateral upper extremities, bilateral lower extremities, hands, feet, fingers, toes, fingernails, and toenails. All findings within normal limits unless otherwise noted below.   Relevant exam findings are noted in the Assessment and Plan.    Assessment & Plan    SEVERE ATOPIC DERMATITIS and PRURITUS Exam: Scaly pink papules coalescing to plaques 80% BSA IGA 1.5, Itch 9/10  Flared  Atopic dermatitis (eczema) is a chronic, relapsing, pruritic condition that can significantly affect quality of life. It is often associated with allergic rhinitis and/or asthma and can require treatment with topical medications, phototherapy, or in severe cases biologic injectable medication (Dupixent ; Adbry ) or Oral JAK inhibitors.  Procedure Note Nemluvio  Injection(s)  Location: B/L Anterior Thighs  Informed Consent: Discussed risks (infection, pain, bleeding, bruising, lack of resolution, and recurrence of lesion) and benefits of the procedure, as well as the alternatives. Informed consent was obtained. Preparation: The area was prepared a standard fashion with Isopropyl Alcohol.  Anesthesia: None (N/A)  Procedure Details: Patient instructed how to complete Nemluvio  Injections with Demonstration Pen. A(n) subcutaneous injection was performed with Nemluvio . 60 mg/0.98 ml total were injected.  Nemluvio  30mg /0.49 ml (OFFICE SAMPLE SUPPLIED) NDC #: 9700-3779-89 Exp: 09/2026 LOT: [id number]  Total number of injections: 2  Plan: The patient was instructed on post-procedure care. Recommend OTC analgesia as needed for pain.  Treatment Plan: - Patient completed Injections while in office at B/L Anterior Thighs, Mild erythema noted at injection site(s).   - Advised to continue topicals for break through Flares - Patient tolerated well and demonstrated understanding - Recommend gentle skin care - Plan to follow up in May ECZEMA, UNSPECIFIED TYPE   This Visit - nemolizumab-ilto  (NEMLUVIO ) SQ injection 60 mg  Return in about 3 months (around 01/12/2025) for Eczema F/U.  I, Aurora Rody, am acting as neurosurgeon for [employer], DO.  Documentation: I have reviewed the above documentation for accuracy and completeness, and I agree with  the above.  Delon Lenis,  DO   "

## 2025-01-12 ENCOUNTER — Ambulatory Visit: Admitting: Dermatology
